# Patient Record
Sex: Female | Born: 1980 | Race: Black or African American | Hispanic: No | Marital: Single | Smoking: Never smoker
Health system: Southern US, Community
[De-identification: ages and names within clinical notes are randomized; demographics above are authoritative.]

## PROBLEM LIST (undated history)

## (undated) DIAGNOSIS — J302 Other seasonal allergic rhinitis: Secondary | ICD-10-CM

## (undated) DIAGNOSIS — R51 Headache: Secondary | ICD-10-CM

## (undated) DIAGNOSIS — H539 Unspecified visual disturbance: Secondary | ICD-10-CM

## (undated) DIAGNOSIS — E893 Postprocedural hypopituitarism: Secondary | ICD-10-CM

## (undated) DIAGNOSIS — D497 Neoplasm of unspecified behavior of endocrine glands and other parts of nervous system: Secondary | ICD-10-CM

## (undated) HISTORY — PX: BRAIN SURGERY: SHX531

## (undated) HISTORY — DX: Postprocedural hypopituitarism: E89.3

---

## 2003-04-15 ENCOUNTER — Other Ambulatory Visit: Admission: RE | Admit: 2003-04-15 | Discharge: 2003-04-15 | Payer: Self-pay | Admitting: Family Medicine

## 2006-02-11 ENCOUNTER — Other Ambulatory Visit: Admission: RE | Admit: 2006-02-11 | Discharge: 2006-02-11 | Payer: Self-pay | Admitting: Family Medicine

## 2006-08-18 ENCOUNTER — Ambulatory Visit (HOSPITAL_COMMUNITY): Admission: RE | Admit: 2006-08-18 | Discharge: 2006-08-18 | Payer: Self-pay | Admitting: Family Medicine

## 2007-08-09 ENCOUNTER — Encounter (INDEPENDENT_AMBULATORY_CARE_PROVIDER_SITE_OTHER): Payer: Self-pay | Admitting: Otolaryngology

## 2007-08-09 ENCOUNTER — Ambulatory Visit (HOSPITAL_BASED_OUTPATIENT_CLINIC_OR_DEPARTMENT_OTHER): Admission: RE | Admit: 2007-08-09 | Discharge: 2007-08-10 | Payer: Self-pay | Admitting: Otolaryngology

## 2007-08-09 HISTORY — PX: SALIVARY GLAND SURGERY: SHX768

## 2007-09-20 ENCOUNTER — Other Ambulatory Visit: Admission: RE | Admit: 2007-09-20 | Discharge: 2007-09-20 | Payer: Self-pay | Admitting: Family Medicine

## 2008-08-21 ENCOUNTER — Emergency Department (HOSPITAL_COMMUNITY): Admission: EM | Admit: 2008-08-21 | Discharge: 2008-08-21 | Payer: Self-pay | Admitting: Family Medicine

## 2008-11-05 ENCOUNTER — Other Ambulatory Visit: Admission: RE | Admit: 2008-11-05 | Discharge: 2008-11-05 | Payer: Self-pay | Admitting: Family Medicine

## 2009-11-20 ENCOUNTER — Other Ambulatory Visit: Admission: RE | Admit: 2009-11-20 | Discharge: 2009-11-20 | Payer: Self-pay | Admitting: Family Medicine

## 2010-08-18 NOTE — Op Note (Signed)
Marisa Galloway, Marisa Galloway              ACCOUNT NO.:  [id number]   MEDICAL RECORD NO.:  [id number]          PATIENT TYPE:  AMB   LOCATION:  DSC                          FACILITY:  [facility]   PHYSICIAN:  Onalee Hua L. 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   SURGICAL PROCEDURES:  Transcervical excision of left submandibular  gland.   SURGEON:  Kinnie Scales. Annalee Genta, MD   ASSISTANT:  Gloris Manchester. Wolicki, MD   COMPLICATIONS:  No complications.   ESTIMATED BLOOD LOSS:  Less than 50 mL.   DISPOSITION:  The patient transferred from the operating room to the  recovery room in stable condition.   BRIEF HISTORY:  The patient is a 30 year old black female who is  referred for evaluation of cervical lymphadenopathy.  She underwent CT  scanning of the neck and was found to have an asymptomatic submandibular  gland stone.  The patient's adenopathy resolved and approximately 6  months later she began to develop intermittent left-sided submandibular  gland swelling, discomfort, and obstruction, particularly when eating.  She was monitored over a 31-month period with increasing symptoms of  recurrent obstruction and pain.  No symptoms of infection.  Given her  history, physical examination, and prior CT scanning, they have  recommended to undertake left submandibular gland excision.  Risks,  benefits, and possible complications of surgical procedure were  discussed in detail with the patient and her mother and they understood  and concurred with our plan for surgery which was scheduled for Aug 09, 2007 under general  anesthesia as an outpatient with overnight  observation at [facility].   PROCEDURE:  The patient was brought to the operating room on Aug 09, 2007, and placed in supine position on the operating table.  General  endotracheal anesthesia was established without difficulty.  When the  patient was adequately anesthetized, she was positioned on the operating  table and prepped and draped in a sterile fashion.  The skin was  injected with 3 mL of 1% lidocaine 1:100,000 solution epinephrine  injected in the proposed skin incision in the anterior left superior  neck.  After allowing adequate time for vasoconstriction and hemostasis,  a 3-cm horizontally oriented incision was created in a preexisting skin  crease.  This was carried through the skin underlying subcutaneous  tissue using a #15 scalpel.  The superficial tissue was transected and  the platysmal layer was identified.  Platysma was divided and  subplatysmal flaps were elevated superiorly and inferiorly.  The  inferior aspect of the submandibular gland was palpated and  submandibular gland fascia was elevated.  Subfascial dissection was  carried out from inferior to superior identifying and preserving the  left marginal mandibular nerve throughout its course.  The vascular  contributions to  the gland including the common facial vein were divided  and suture ligated.  The gland was then gently distracted.  The anterior  and posterior bellies of the digastric muscle were identified and the  hypoglossal nerve was identified in the deep aspect of the left  submandibular space.  The gland was then gently dissected from the  surrounding structures.  The left lingual nerve was identified.  Its  contribution of the gland was divided and suture ligated and the nerve  was preserved throughout its course.  The mylohyoid muscle was elevated  anteriorly and the submandibular duct was palpated extending from the  gland to  the floor of mouth.  A large approximately 1 cm firm stone was  identified within the hilum of the gland and there was a significant  amount of inflammation between the gland and the surrounding tissues.  No evidence of active infection or purulent material.  The duct was  traced anteriorly.  The stone was palpated within the substance of the  gland itself and the left submandibular duct was divided and suture  ligated.  The patient's incision was then thoroughly irrigated with  sterile saline.  A 1/4-inch Penrose drain was then placed at the depth  of the incision and sutured into position.  The patient's incision was  then closed in multiple layers beginning with reapproximation of the  platysmal muscle with interrupted 4-0 Vicryl suture, deep subcutaneous  tissue was closed with the same stitch, and final skin edges were closed  with 5-0 Ethilon suture in an interrupted fashion.  The patient was then  awakened from anesthetic, she was extubated, and was transferred from  the operating room to recovery room in stable condition.  There were no  complications.  Blood loss was less than 50 mL.           ______________________________  Kinnie Scales. Annalee Genta, M.D.     DLS/MEDQ  D:  04/54/0981  T:  08/10/2007  Job:  [id number]

## 2010-12-24 ENCOUNTER — Other Ambulatory Visit: Payer: Self-pay | Admitting: Family Medicine

## 2010-12-24 ENCOUNTER — Other Ambulatory Visit (HOSPITAL_COMMUNITY)
Admission: RE | Admit: 2010-12-24 | Discharge: 2010-12-24 | Disposition: A | Discharge status: Home / Self Care | Payer: 59 | Source: Ambulatory Visit | Attending: Family Medicine | Admitting: Family Medicine

## 2010-12-24 DIAGNOSIS — Z01419 Encounter for gynecological examination (general) (routine) without abnormal findings: Secondary | ICD-10-CM | POA: Insufficient documentation

## 2010-12-24 DIAGNOSIS — Z113 Encounter for screening for infections with a predominantly sexual mode of transmission: Secondary | ICD-10-CM | POA: Insufficient documentation

## 2011-05-13 ENCOUNTER — Ambulatory Visit (HOSPITAL_COMMUNITY)
Admission: RE | Admit: 2011-05-13 | Discharge: 2011-05-13 | Disposition: A | Discharge status: Home / Self Care | Payer: 59 | Source: Ambulatory Visit | Attending: Specialist | Admitting: Specialist

## 2011-05-13 ENCOUNTER — Other Ambulatory Visit (HOSPITAL_COMMUNITY): Payer: Self-pay | Admitting: Specialist

## 2011-05-13 DIAGNOSIS — H5347 Heteronymous bilateral field defects: Secondary | ICD-10-CM

## 2011-05-13 MED ORDER — GADOBENATE DIMEGLUMINE 529 MG/ML IV SOLN
8.0000 mL | Freq: Once | INTRAVENOUS | Status: AC | PRN
  Administered 2011-05-13: 8 mL via INTRAVENOUS

## 2011-05-14 ENCOUNTER — Other Ambulatory Visit: Payer: Self-pay | Admitting: Neurosurgery

## 2011-05-14 DIAGNOSIS — D353 Benign neoplasm of craniopharyngeal duct: Secondary | ICD-10-CM

## 2011-05-14 DIAGNOSIS — D352 Benign neoplasm of pituitary gland: Secondary | ICD-10-CM

## 2011-05-17 ENCOUNTER — Other Ambulatory Visit (HOSPITAL_COMMUNITY): Payer: Self-pay | Admitting: *Deleted

## 2011-05-17 ENCOUNTER — Encounter (HOSPITAL_COMMUNITY)
Admission: RE | Admit: 2011-05-17 | Discharge: 2011-05-17 | Disposition: A | Discharge status: Home / Self Care | Payer: 59 | Source: Ambulatory Visit | Attending: Neurosurgery | Admitting: Neurosurgery

## 2011-05-17 ENCOUNTER — Encounter (HOSPITAL_COMMUNITY): Payer: Self-pay | Admitting: *Deleted

## 2011-05-17 ENCOUNTER — Encounter (HOSPITAL_COMMUNITY): Payer: Self-pay | Admitting: Pharmacy Technician

## 2011-05-17 LAB — COMPREHENSIVE METABOLIC PANEL
ALT: 11 U/L (ref 0–35)
AST: 13 U/L (ref 0–37)
Albumin: 3.5 g/dL (ref 3.5–5.2)
Alkaline Phosphatase: 63 U/L (ref 39–117)
BUN: 8 mg/dL (ref 6–23)
CO2: 25 mEq/L (ref 19–32)
Calcium: 9.9 mg/dL (ref 8.4–10.5)
Chloride: 100 mEq/L (ref 96–112)
Creatinine, Ser: 0.68 mg/dL (ref 0.50–1.10)
GFR calc Af Amer: >90 mL/min (ref >90)
GFR calc non Af Amer: >90 mL/min (ref >90)
Glucose, Bld: 77 mg/dL (ref 70–99)
Potassium: 4.3 mEq/L (ref 3.5–5.1)
Sodium: 137 mEq/L (ref 135–145)
Total Bilirubin: 0.4 mg/dL (ref 0.3–1.2)
Total Protein: 8.1 g/dL (ref 6.0–8.3)

## 2011-05-17 LAB — PROLACTIN: Prolactin: 41.6 ng/mL

## 2011-05-17 LAB — CBC
HCT: 41.7 % (ref 36.0–46.0)
Hemoglobin: 13.8 g/dL (ref 12.0–15.0)
MCH: 29.9 pg (ref 26.0–34.0)
MCHC: 33.1 g/dL (ref 30.0–36.0)
MCV: 90.3 fL (ref 78.0–100.0)
Platelets: 336 10*3/uL (ref 150–400)
RBC: 4.62 MIL/uL (ref 3.87–5.11)
RDW: 14.2 % (ref 11.5–15.5)
WBC: 7.8 10*3/uL (ref 4.0–10.5)

## 2011-05-17 LAB — DIFFERENTIAL
Basophils Absolute: 0 10*3/uL (ref 0.0–0.1)
Basophils Relative: 0 % (ref 0–1)
Eosinophils Absolute: 0.1 10*3/uL (ref 0.0–0.7)
Eosinophils Relative: 1 % (ref 0–5)
Lymphocytes Relative: 49 % — ABNORMAL HIGH (ref 12–46)
Lymphs Abs: 3.8 10*3/uL (ref 0.7–4.0)
Monocytes Absolute: 0.6 10*3/uL (ref 0.1–1.0)
Monocytes Relative: 8 % (ref 3–12)
Neutro Abs: 3.3 10*3/uL (ref 1.7–7.7)
Neutrophils Relative %: 42 % — ABNORMAL LOW (ref 43–77)

## 2011-05-17 LAB — URINALYSIS, ROUTINE W REFLEX MICROSCOPIC
Bilirubin Urine: NEGATIVE
Glucose, UA: NEGATIVE mg/dL
Ketones, ur: NEGATIVE mg/dL
Leukocytes, UA: NEGATIVE
Nitrite: NEGATIVE
Protein, ur: NEGATIVE mg/dL
Specific Gravity, Urine: 1.018 (ref 1.005–1.030)
Urobilinogen, UA: 1 mg/dL (ref 0.0–1.0)
pH: 6 (ref 5.0–8.0)

## 2011-05-17 LAB — CORTISOL-AM, BLOOD: Cortisol - AM: 26.2 ug/dL — ABNORMAL HIGH (ref 4.3–22.4)

## 2011-05-17 LAB — TYPE AND SCREEN
ABO/RH(D): O POS
Antibody Screen: NEGATIVE

## 2011-05-17 LAB — URINE MICROSCOPIC-ADD ON

## 2011-05-17 LAB — LUTEINIZING HORMONE: LH: <0.1 m[IU]/mL

## 2011-05-17 LAB — SURGICAL PCR SCREEN
MRSA, PCR: NEGATIVE
Staphylococcus aureus: NEGATIVE

## 2011-05-17 LAB — ABO/RH: ABO/RH(D): O POS

## 2011-05-17 LAB — T4: T4, Total: 12.8 ug/dL — ABNORMAL HIGH (ref 5.0–12.5)

## 2011-05-17 LAB — TSH: TSH: 1.638 u[IU]/mL (ref 0.350–4.500)

## 2011-05-17 LAB — FOLLICLE STIMULATING HORMONE: FSH: <0.3 m[IU]/mL

## 2011-05-17 MED ORDER — CEFAZOLIN SODIUM-DEXTROSE 2-3 GM-% IV SOLR
2.0000 g | INTRAVENOUS | Status: DC
  Filled 2011-05-17: qty 50

## 2011-05-17 NOTE — Progress Notes (Signed)
Left message for [facility], Dr. Thurmon Fair nurse, requesting orders for consent for his portion of the surgery.

## 2011-05-17 NOTE — Progress Notes (Signed)
Per Angelica Chessman in Dr. Thurmon Fair office no orders required.

## 2011-05-17 NOTE — Pre-Procedure Instructions (Addendum)
20 Marisa Galloway  05/17/2011   Your procedure is scheduled on:  Tuesday, February 12  Report to [facility] at 5:30 AM.  Call this number if you have problems the morning of surgery: [phone]   Remember:   Do not eat food:After Midnight.  May have clear liquids: up to 4 Hours before arrival.  Clear liquids include soda, tea, black coffee, apple or grape juice, broth.  Take these medicines the morning of surgery with A SIP OF WATER: none   Do not wear jewelry, make-up or nail polish.  Do not wear lotions, powders, or perfumes. You may wear deodorant.  Do not shave 48 hours prior to surgery.  Do not bring valuables to the hospital.  Contacts, dentures or bridgework may not be worn into surgery.  Leave suitcase in the car. After surgery it may be brought to your room.  For patients admitted to the hospital, checkout time is 11:00 AM the day of discharge.   Patients discharged the day of surgery will not be allowed to drive home.  Name and phone number of your driver: NA  Special Instructions: CHG Shower Use Special Wash: 1/2 bottle night before surgery and 1/2 bottle morning of surgery.   Please read over the following fact sheets that you were given: Pain Booklet, Coughing and Deep Breathing, Blood Transfusion Information and Surgical Site Infection Prevention

## 2011-05-18 ENCOUNTER — Encounter (HOSPITAL_COMMUNITY)
Admission: RE | Disposition: A | Discharge status: Home / Self Care | Payer: Self-pay | Source: Ambulatory Visit | Attending: Neurosurgery

## 2011-05-18 ENCOUNTER — Encounter (HOSPITAL_COMMUNITY): Payer: Self-pay | Admitting: *Deleted

## 2011-05-18 ENCOUNTER — Ambulatory Visit (HOSPITAL_COMMUNITY): Payer: 59 | Admitting: Anesthesiology

## 2011-05-18 ENCOUNTER — Inpatient Hospital Stay (HOSPITAL_COMMUNITY)
Admission: RE | Admit: 2011-05-18 | Discharge: 2011-05-25 | DRG: 615 | Disposition: A | Discharge status: Home / Self Care | Payer: 59 | Source: Ambulatory Visit | Attending: Neurosurgery | Admitting: Neurosurgery

## 2011-05-18 ENCOUNTER — Ambulatory Visit (HOSPITAL_COMMUNITY): Payer: 59

## 2011-05-18 ENCOUNTER — Encounter (HOSPITAL_COMMUNITY): Payer: Self-pay | Admitting: Anesthesiology

## 2011-05-18 ENCOUNTER — Other Ambulatory Visit: Payer: Self-pay | Admitting: Neurosurgery

## 2011-05-18 DIAGNOSIS — D352 Benign neoplasm of pituitary gland: Principal | ICD-10-CM | POA: Diagnosis present

## 2011-05-18 DIAGNOSIS — H5347 Heteronymous bilateral field defects: Secondary | ICD-10-CM | POA: Diagnosis present

## 2011-05-18 DIAGNOSIS — D353 Benign neoplasm of craniopharyngeal duct: Principal | ICD-10-CM | POA: Diagnosis present

## 2011-05-18 HISTORY — PX: NASAL SINUS SURGERY: SHX719

## 2011-05-18 HISTORY — DX: Unspecified visual disturbance: H53.9

## 2011-05-18 HISTORY — DX: Neoplasm of unspecified behavior of endocrine glands and other parts of nervous system: D49.7

## 2011-05-18 HISTORY — PX: CRANIOTOMY: SHX93

## 2011-05-18 HISTORY — DX: Headache: R51

## 2011-05-18 LAB — GRAM STAIN: Gram Stain: NONE SEEN

## 2011-05-18 LAB — BASIC METABOLIC PANEL
BUN: 5 mg/dL — ABNORMAL LOW (ref 6–23)
CO2: 20 mEq/L (ref 19–32)
Calcium: 8.1 mg/dL — ABNORMAL LOW (ref 8.4–10.5)
Chloride: 104 mEq/L (ref 96–112)
Creatinine, Ser: 0.52 mg/dL (ref 0.50–1.10)
GFR calc Af Amer: >90 mL/min (ref >90)
GFR calc non Af Amer: >90 mL/min (ref >90)
Glucose, Bld: 172 mg/dL — ABNORMAL HIGH (ref 70–99)
Potassium: 4 mEq/L (ref 3.5–5.1)
Sodium: 137 mEq/L (ref 135–145)

## 2011-05-18 LAB — ACTH: C206 ACTH: 10 pg/mL (ref 10–46)

## 2011-05-18 LAB — INSULIN-LIKE GROWTH FACTOR: Somatomedin C: 119 ng/mL (ref 66–336)

## 2011-05-18 LAB — POCT URINE SPECIFIC GRAVITY, REFRACTOMETER: Specific gravity, refractometer-urine: 1.0097 (ref 1.001–1.035)

## 2011-05-18 LAB — ESTROGENS, TOTAL: Estrogen: 94.4 pg/mL

## 2011-05-18 SURGERY — CRANIOTOMY HYPOPHYSECTOMY TRANSNASAL APPROACH
Anesthesia: General | Site: Spine Lumbar | Wound class: Clean Contaminated

## 2011-05-18 MED ORDER — MORPHINE SULFATE 10 MG/ML IJ SOLN
INTRAMUSCULAR | Status: DC | PRN
  Administered 2011-05-18: 4 mg via INTRAVENOUS

## 2011-05-18 MED ORDER — NALOXONE HCL 0.4 MG/ML IJ SOLN
INTRAMUSCULAR | Status: AC
  Filled 2011-05-18: qty 1

## 2011-05-18 MED ORDER — MAGNESIUM HYDROXIDE 400 MG/5ML PO SUSP: 30.0000 mL | Freq: Every day | ORAL | Status: DC | PRN

## 2011-05-18 MED ORDER — CYCLOBENZAPRINE HCL 5 MG PO TABS
5.0000 mg | ORAL_TABLET | Freq: Three times a day (TID) | ORAL | Status: DC | PRN
  Administered 2011-05-18 – 2011-05-19 (×3): 5 mg via ORAL
  Administered 2011-05-19 – 2011-05-22 (×5): 10 mg via ORAL
  Filled 2011-05-18 (×7): qty 2

## 2011-05-18 MED ORDER — MORPHINE SULFATE 2 MG/ML IJ SOLN
1.0000 mg | INTRAMUSCULAR | Status: DC | PRN
  Administered 2011-05-18 (×2): 2 mg via INTRAVENOUS
  Administered 2011-05-18 (×2): 1 mg via INTRAVENOUS
  Administered 2011-05-19 – 2011-05-24 (×8): 2 mg via INTRAVENOUS
  Filled 2011-05-18 (×12): qty 1

## 2011-05-18 MED ORDER — CEFAZOLIN SODIUM 1-5 GM-% IV SOLN
INTRAVENOUS | Status: AC
  Filled 2011-05-18: qty 50

## 2011-05-18 MED ORDER — PROPOFOL 10 MG/ML IV EMUL
INTRAVENOUS | Status: DC | PRN
  Administered 2011-05-18: 200 mg via INTRAVENOUS

## 2011-05-18 MED ORDER — HYDROMORPHONE HCL PF 1 MG/ML IJ SOLN: 0.2500 mg | INTRAMUSCULAR | Status: DC | PRN

## 2011-05-18 MED ORDER — LABETALOL HCL 5 MG/ML IV SOLN
INTRAVENOUS | Status: AC
  Filled 2011-05-18: qty 4

## 2011-05-18 MED ORDER — PANTOPRAZOLE SODIUM 40 MG IV SOLR
40.0000 mg | INTRAVENOUS | Status: AC
  Administered 2011-05-18: 40 mg via INTRAVENOUS
  Filled 2011-05-18: qty 40

## 2011-05-18 MED ORDER — PROMETHAZINE HCL 25 MG/ML IJ SOLN: 6.2500 mg | INTRAMUSCULAR | Status: DC | PRN

## 2011-05-18 MED ORDER — CHLORHEXIDINE GLUCONATE 0.12 % MT SOLN
OROMUCOSAL | Status: AC
  Administered 2011-05-18: 15:00:00
  Filled 2011-05-18: qty 15

## 2011-05-18 MED ORDER — LIDOCAINE HCL (CARDIAC) 20 MG/ML IV SOLN
INTRAVENOUS | Status: DC | PRN
  Administered 2011-05-18: 40 mg via INTRAVENOUS

## 2011-05-18 MED ORDER — GLYCOPYRROLATE 0.2 MG/ML IJ SOLN
INTRAMUSCULAR | Status: DC | PRN
  Administered 2011-05-18: .6 mg via INTRAVENOUS

## 2011-05-18 MED ORDER — BISACODYL 10 MG RE SUPP
10.0000 mg | Freq: Every day | RECTAL | Status: DC | PRN
  Administered 2011-05-22: 10 mg via RECTAL
  Filled 2011-05-18: qty 1

## 2011-05-18 MED ORDER — DEXTROSE 5 % IV SOLN
1.0000 g | INTRAVENOUS | Status: DC
  Filled 2011-05-18: qty 10

## 2011-05-18 MED ORDER — HYDRALAZINE HCL 20 MG/ML IJ SOLN: 5.0000 mg | INTRAMUSCULAR | Status: DC | PRN

## 2011-05-18 MED ORDER — KCL IN DEXTROSE-NACL 20-5-0.45 MEQ/L-%-% IV SOLN: INTRAVENOUS | Status: DC

## 2011-05-18 MED ORDER — SODIUM CHLORIDE 0.9 % IV SOLN
INTRAVENOUS | Status: AC
  Filled 2011-05-18: qty 500

## 2011-05-18 MED ORDER — MEPERIDINE HCL 25 MG/ML IJ SOLN: 6.2500 mg | INTRAMUSCULAR | Status: DC | PRN

## 2011-05-18 MED ORDER — HYDROXYZINE HCL 50 MG PO TABS
50.0000 mg | ORAL_TABLET | ORAL | Status: DC | PRN
  Filled 2011-05-18: qty 1

## 2011-05-18 MED ORDER — MIDAZOLAM HCL 5 MG/5ML IJ SOLN
INTRAMUSCULAR | Status: DC | PRN
  Administered 2011-05-18: 2 mg via INTRAVENOUS

## 2011-05-18 MED ORDER — LACTATED RINGERS IV SOLN
INTRAVENOUS | Status: DC | PRN
  Administered 2011-05-18: 07:00:00 via INTRAVENOUS

## 2011-05-18 MED ORDER — ROCURONIUM BROMIDE 100 MG/10ML IV SOLN
INTRAVENOUS | Status: DC | PRN
  Administered 2011-05-18: 50 mg via INTRAVENOUS

## 2011-05-18 MED ORDER — NEOSTIGMINE METHYLSULFATE 1 MG/ML IJ SOLN
INTRAMUSCULAR | Status: DC | PRN
  Administered 2011-05-18: 4 mg via INTRAVENOUS

## 2011-05-18 MED ORDER — LABETALOL HCL 5 MG/ML IV SOLN: 10.0000 mg | INTRAVENOUS | Status: DC | PRN

## 2011-05-18 MED ORDER — POTASSIUM CHLORIDE 2 MEQ/ML IV SOLN
Freq: Once | INTRAVENOUS | Status: AC
  Administered 2011-05-18: 100 mL/h via INTRAVENOUS
  Filled 2011-05-18: qty 1000

## 2011-05-18 MED ORDER — MUPIROCIN 2 % EX OINT
TOPICAL_OINTMENT | CUTANEOUS | Status: DC | PRN
  Administered 2011-05-18: 1 via NASAL

## 2011-05-18 MED ORDER — ONDANSETRON HCL 4 MG/2ML IJ SOLN
INTRAMUSCULAR | Status: DC | PRN
  Administered 2011-05-18: 4 mg via INTRAVENOUS

## 2011-05-18 MED ORDER — ESMOLOL HCL 10 MG/ML IV SOLN
INTRAVENOUS | Status: DC | PRN
  Administered 2011-05-18: 20 mg via INTRAVENOUS

## 2011-05-18 MED ORDER — HEMOSTATIC AGENTS (NO CHARGE) OPTIME
TOPICAL | Status: DC | PRN
  Administered 2011-05-18: 1 via TOPICAL

## 2011-05-18 MED ORDER — OXYMETAZOLINE HCL 0.05 % NA SOLN
NASAL | Status: DC | PRN
  Administered 2011-05-18 (×2): 1 via NASAL

## 2011-05-18 MED ORDER — DROSPIRENONE-ETHINYL ESTRADIOL 3-0.02 MG PO TABS
1.0000 | ORAL_TABLET | Freq: Every day | ORAL | Status: DC
Start: 2011-05-19 — End: 2011-05-19

## 2011-05-18 MED ORDER — SODIUM CHLORIDE 0.9 % IV SOLN
INTRAVENOUS | Status: DC | PRN
  Administered 2011-05-18 (×2): via INTRAVENOUS

## 2011-05-18 MED ORDER — VECURONIUM BROMIDE 10 MG IV SOLR
INTRAVENOUS | Status: DC | PRN
  Administered 2011-05-18: 1 mg via INTRAVENOUS
  Administered 2011-05-18 (×2): 3 mg via INTRAVENOUS

## 2011-05-18 MED ORDER — PANTOPRAZOLE SODIUM 40 MG IV SOLR
40.0000 mg | Freq: Every day | INTRAVENOUS | Status: DC
  Administered 2011-05-18 – 2011-05-19 (×2): 40 mg via INTRAVENOUS
  Filled 2011-05-18 (×4): qty 40

## 2011-05-18 MED ORDER — HYDROCODONE-ACETAMINOPHEN 5-325 MG PO TABS
1.0000 | ORAL_TABLET | ORAL | Status: DC | PRN
  Administered 2011-05-23: 2 via ORAL
  Administered 2011-05-23 – 2011-05-24 (×2): 1 via ORAL
  Filled 2011-05-18: qty 1
  Filled 2011-05-18 (×2): qty 2

## 2011-05-18 MED ORDER — LIDOCAINE-EPINEPHRINE 1 %-1:100000 IJ SOLN
INTRAMUSCULAR | Status: DC | PRN
  Administered 2011-05-18: 8 mL
  Administered 2011-05-18: 5 mL

## 2011-05-18 MED ORDER — MUPIROCIN CALCIUM 2 % EX CREA
TOPICAL_CREAM | Freq: Three times a day (TID) | CUTANEOUS | Status: DC
  Administered 2011-05-18 – 2011-05-24 (×17): via TOPICAL
  Filled 2011-05-18 (×2): qty 15

## 2011-05-18 MED ORDER — THROMBIN 5000 UNITS EX KIT
PACK | CUTANEOUS | Status: DC | PRN
  Administered 2011-05-18 (×2): 5000 [IU] via TOPICAL

## 2011-05-18 MED ORDER — FENTANYL CITRATE 0.05 MG/ML IJ SOLN
INTRAMUSCULAR | Status: DC | PRN
  Administered 2011-05-18 (×3): 50 ug via INTRAVENOUS
  Administered 2011-05-18: 100 ug via INTRAVENOUS
  Administered 2011-05-18 (×4): 50 ug via INTRAVENOUS

## 2011-05-18 MED ORDER — DEXTROSE 5 % IV SOLN
1.0000 g | INTRAVENOUS | Status: DC
  Administered 2011-05-18 – 2011-05-25 (×8): 1 g via INTRAVENOUS
  Filled 2011-05-18 (×8): qty 10

## 2011-05-18 MED ORDER — KCL IN DEXTROSE-NACL 20-5-0.45 MEQ/L-%-% IV SOLN
INTRAVENOUS | Status: DC
  Administered 2011-05-18: 16:00:00 via INTRAVENOUS
  Filled 2011-05-18 (×4): qty 1000

## 2011-05-18 MED ORDER — 0.9 % SODIUM CHLORIDE (POUR BTL) OPTIME
TOPICAL | Status: DC | PRN
  Administered 2011-05-18: 1000 mL

## 2011-05-18 MED ORDER — BACITRACIN 50000 UNITS IM SOLR
INTRAMUSCULAR | Status: AC
  Filled 2011-05-18: qty 1

## 2011-05-18 MED ORDER — OXYMETAZOLINE HCL 0.05 % NA SOLN
NASAL | Status: DC | PRN
  Administered 2011-05-18: 2 via NASAL

## 2011-05-18 MED ORDER — BUPIVACAINE HCL (PF) 0.5 % IJ SOLN
INTRAMUSCULAR | Status: DC | PRN
  Administered 2011-05-18: 5 mL

## 2011-05-18 MED ORDER — HYDROXYZINE HCL 50 MG/ML IM SOLN
50.0000 mg | Freq: Four times a day (QID) | INTRAMUSCULAR | Status: DC | PRN
  Filled 2011-05-18: qty 1

## 2011-05-18 MED ORDER — PHENYLEPHRINE HCL 10 MG/ML IJ SOLN
10.0000 mg | INTRAVENOUS | Status: DC | PRN
  Administered 2011-05-18: 10 ug/min via INTRAVENOUS

## 2011-05-18 SURGICAL SUPPLY — 128 items
ADH SKN CLS APL DERMABOND .7 (GAUZE/BANDAGES/DRESSINGS) ×2
APL SKNCLS STERI-STRIP NONHPOA (GAUZE/BANDAGES/DRESSINGS)
ATTRACTOMAT 16X20 MAGNETIC DRP (DRAPES) IMPLANT
BAG DECANTER FOR FLEXI CONT (MISCELLANEOUS) IMPLANT
BALL CTTN LRG ABS STRL LF (GAUZE/BANDAGES/DRESSINGS)
BENZOIN TINCTURE PRP APPL 2/3 (GAUZE/BANDAGES/DRESSINGS) ×2 IMPLANT
BLADE EYE SICKLE 84 5 BEAV (BLADE) IMPLANT
BLADE IRRIGATOR 40D CVD (IRRIGATION / IRRIGATOR) IMPLANT
BLADE IRRIGATOR 60D CVD (IRRIGATION / IRRIGATOR) IMPLANT
BLADE SURG 10 STRL SS (BLADE) ×3 IMPLANT
BLADE SURG 11 STRL SS (BLADE) ×5 IMPLANT
BLADE SURG 15 STRL LF DISP TIS (BLADE) ×2 IMPLANT
BLADE SURG 15 STRL SS (BLADE) ×3
CANISTER SUCTION 2500CC (MISCELLANEOUS) ×7 IMPLANT
CATH ROBINSON RED A/P 10FR (CATHETERS) ×2 IMPLANT
CATH ROBINSON RED A/P 14FR (CATHETERS) IMPLANT
CLOTH BEACON ORANGE TIMEOUT ST (SAFETY) ×3 IMPLANT
CONT SPEC 4OZ CLIKSEAL STRL BL (MISCELLANEOUS) ×6 IMPLANT
CORDS BIPOLAR (ELECTRODE) ×3 IMPLANT
COTTONBALL LRG STERILE PKG (GAUZE/BANDAGES/DRESSINGS) ×2 IMPLANT
COVER SURGICAL LIGHT HANDLE (MISCELLANEOUS) ×2 IMPLANT
DECANTER SPIKE VIAL GLASS SM (MISCELLANEOUS) ×3 IMPLANT
DEPRESSOR TONGUE BLADE STERILE (MISCELLANEOUS) ×2 IMPLANT
DERMABOND ADVANCED (GAUZE/BANDAGES/DRESSINGS) ×1
DERMABOND ADVANCED .7 DNX12 (GAUZE/BANDAGES/DRESSINGS) IMPLANT
DRAIN SUBARACHNOID (WOUND CARE) ×1 IMPLANT
DRAPE C-ARM 42X72 X-RAY (DRAPES) ×6 IMPLANT
DRAPE EENT ADH APERT 15X15 STR (DRAPES) IMPLANT
DRAPE INCISE IOBAN 66X45 STRL (DRAPES) ×4 IMPLANT
DRAPE MICROSCOPE LEICA (MISCELLANEOUS) ×3 IMPLANT
DRAPE POUCH INSTRU U-SHP 10X18 (DRAPES) ×3 IMPLANT
DRAPE PROXIMA HALF (DRAPES) ×7 IMPLANT
DRESSING NASAL POPE 10X1.5X2.5 (GAUZE/BANDAGES/DRESSINGS) IMPLANT
DRESSING TELFA 8X3 (GAUZE/BANDAGES/DRESSINGS) ×6 IMPLANT
DRSG NASAL POPE 10X1.5X2.5 (GAUZE/BANDAGES/DRESSINGS) ×3
DRSG OPSITE 4X5.5 SM (GAUZE/BANDAGES/DRESSINGS) ×1 IMPLANT
DURAFORM COLLAGEN 1X1 5-PACK (Neuro Prosthesis/Implant) ×1 IMPLANT
DURAPREP 26ML APPLICATOR (WOUND CARE) ×3 IMPLANT
DURASEAL SPINE SEALANT 3ML (MISCELLANEOUS) ×1 IMPLANT
ELECT CAUTERY BLADE 6.4 (BLADE) ×3 IMPLANT
ELECT COATED BLADE 2.86 ST (ELECTRODE) ×3 IMPLANT
ELECT NDL TIP 2.8 STRL (NEEDLE) ×2 IMPLANT
ELECT NEEDLE TIP 2.8 STRL (NEEDLE) ×3 IMPLANT
ELECT REM PT RETURN 9FT ADLT (ELECTROSURGICAL) ×6
ELECTRODE REM PT RTRN 9FT ADLT (ELECTROSURGICAL) ×4 IMPLANT
GAUZE PACKING FOLDED 1IN STRL (GAUZE/BANDAGES/DRESSINGS) ×2 IMPLANT
GAUZE PACKING FOLDED 2  STR (GAUZE/BANDAGES/DRESSINGS)
GAUZE PACKING FOLDED 2 STR (GAUZE/BANDAGES/DRESSINGS) ×2 IMPLANT
GAUZE SPONGE 2X2 8PLY STRL LF (GAUZE/BANDAGES/DRESSINGS) ×2 IMPLANT
GLOVE BIOGEL M 7.0 STRL (GLOVE) ×4 IMPLANT
GLOVE BIOGEL PI IND STRL 6.5 (GLOVE) IMPLANT
GLOVE BIOGEL PI IND STRL 8 (GLOVE) ×2 IMPLANT
GLOVE BIOGEL PI INDICATOR 6.5 (GLOVE) ×2
GLOVE BIOGEL PI INDICATOR 8 (GLOVE) ×1
GLOVE ECLIPSE 7.5 STRL STRAW (GLOVE) ×6 IMPLANT
GLOVE EXAM NITRILE LRG STRL (GLOVE) IMPLANT
GLOVE EXAM NITRILE MD LF STRL (GLOVE) IMPLANT
GLOVE EXAM NITRILE XL STR (GLOVE) IMPLANT
GLOVE EXAM NITRILE XS STR PU (GLOVE) IMPLANT
GLOVE SURG SS PI 6.5 STRL IVOR (GLOVE) ×2 IMPLANT
GOWN BRE IMP SLV AUR LG STRL (GOWN DISPOSABLE) ×2 IMPLANT
GOWN BRE IMP SLV AUR XL STRL (GOWN DISPOSABLE) ×3 IMPLANT
GOWN STRL REIN 2XL LVL4 (GOWN DISPOSABLE) IMPLANT
HEMOSTAT SURGICEL 2X14 (HEMOSTASIS) ×2 IMPLANT
IRRIGATOR 4MM STR (IRRIGATION / IRRIGATOR) ×2 IMPLANT
KIT BASIN OR (CUSTOM PROCEDURE TRAY) ×5 IMPLANT
KIT DRAIN CSF ACCUDRAIN (MISCELLANEOUS) ×1 IMPLANT
KIT ROOM TURNOVER OR (KITS) ×3 IMPLANT
MARKER SKIN DUAL TIP RULER LAB (MISCELLANEOUS) ×3 IMPLANT
MARKER SPHERE PSV REFLC NDI (MISCELLANEOUS) ×10 IMPLANT
MIS BUR, 2.5 MM NEURO ×1 IMPLANT
NDL 18GX1X1/2 (RX/OR ONLY) (NEEDLE) IMPLANT
NDL HYPO 25X1 1.5 SAFETY (NEEDLE) ×2 IMPLANT
NDL SPNL 22GX3.5 QUINCKE BK (NEEDLE) ×2 IMPLANT
NEEDLE 18GX1X1/2 (RX/OR ONLY) (NEEDLE) IMPLANT
NEEDLE HYPO 25X1 1.5 SAFETY (NEEDLE) ×3 IMPLANT
NEEDLE SPNL 22GX3.5 QUINCKE BK (NEEDLE) ×3 IMPLANT
NS IRRIG 1000ML POUR BTL (IV SOLUTION) ×3 IMPLANT
PAD ARMBOARD 7.5X6 YLW CONV (MISCELLANEOUS) ×7 IMPLANT
PATTIES SURGICAL .25X.25 (GAUZE/BANDAGES/DRESSINGS) IMPLANT
PATTIES SURGICAL .5 X.5 (GAUZE/BANDAGES/DRESSINGS) ×3 IMPLANT
PATTIES SURGICAL .5 X3 (DISPOSABLE) ×2 IMPLANT
PENCIL BUTTON HOLSTER BLD 10FT (ELECTRODE) ×3 IMPLANT
RUBBERBAND STERILE (MISCELLANEOUS) ×6 IMPLANT
SEALANT DURASEAL SPINE 3ML (Neuro Prosthesis/Implant) ×1 IMPLANT
SET DIEGO TUBE 70338003 (OTOLOGIC RELATED) ×2 IMPLANT
SHEET SIL 040 (INSTRUMENTS) IMPLANT
SPECIMEN JAR SMALL (MISCELLANEOUS) ×5 IMPLANT
SPLINT NASAL DOYLE BI-VL (GAUZE/BANDAGES/DRESSINGS) ×1 IMPLANT
SPONGE GAUZE 2X2 STER 10/PKG (GAUZE/BANDAGES/DRESSINGS) ×1
SPONGE GAUZE 4X4 12PLY (GAUZE/BANDAGES/DRESSINGS) ×4 IMPLANT
SPONGE LAP 4X18 X RAY DECT (DISPOSABLE) ×3 IMPLANT
SPONGE NEURO XRAY DETECT 1X3 (DISPOSABLE) ×3 IMPLANT
SPONGE SURGIFOAM ABS GEL 100 (HEMOSTASIS) IMPLANT
SPONGE SURGIFOAM ABS GEL 12-7 (HEMOSTASIS) IMPLANT
STAPLER SKIN PROX WIDE 3.9 (STAPLE) ×3 IMPLANT
STRIP CLOSURE SKIN 1/2X4 (GAUZE/BANDAGES/DRESSINGS) IMPLANT
STRIP CLOSURE SKIN 1/4X4 (GAUZE/BANDAGES/DRESSINGS) IMPLANT
SUT BONE WAX W31G (SUTURE) ×3 IMPLANT
SUT CHROMIC 3 0 PS 2 (SUTURE) IMPLANT
SUT CHROMIC 4 0 P 3 18 (SUTURE) IMPLANT
SUT ETHILON 3 0 FSL (SUTURE) IMPLANT
SUT ETHILON 3 0 PS 1 (SUTURE) ×2 IMPLANT
SUT ETHILON 4 0 PS 2 18 (SUTURE) IMPLANT
SUT ETHILON 6 0 P 1 (SUTURE) ×1 IMPLANT
SUT NOVAFIL 6 0 PRE 2 4412 13 (SUTURE) IMPLANT
SUT PLAIN 4 0 ~~LOC~~ 1 (SUTURE) ×1 IMPLANT
SUT PROLENE 6 0 BV (SUTURE) IMPLANT
SUT SILK 0 TIES 10X30 (SUTURE) ×1 IMPLANT
SUT VIC AB 2-0 CP2 18 (SUTURE) ×3 IMPLANT
SUT VIC AB 2-0 CT1 27 (SUTURE)
SUT VIC AB 2-0 CT1 27XBRD (SUTURE) IMPLANT
SUT VIC AB 3-0 SH 8-18 (SUTURE) ×3 IMPLANT
SUT VIC AB 4-0 P-3 18X BRD (SUTURE) IMPLANT
SUT VIC AB 4-0 P3 18 (SUTURE) ×3
SWAB COLLECTION DEVICE MRSA (MISCELLANEOUS) IMPLANT
SYR 5ML LL (SYRINGE) ×3 IMPLANT
SYR BULB 3OZ (MISCELLANEOUS) IMPLANT
SYR CONTROL 10ML LL (SYRINGE) ×6 IMPLANT
SYR TB 1ML 25GX5/8 (SYRINGE) IMPLANT
TOWEL OR 17X24 6PK STRL BLUE (TOWEL DISPOSABLE) ×4 IMPLANT
TOWEL OR 17X26 10 PK STRL BLUE (TOWEL DISPOSABLE) ×4 IMPLANT
TRAP SPECIMEN MUCOUS 40CC (MISCELLANEOUS) IMPLANT
TRAY ENT MC OR (CUSTOM PROCEDURE TRAY) ×5 IMPLANT
TRAY FOLEY CATH 16FRSI W/METER (SET/KITS/TRAYS/PACK) ×3 IMPLANT
TUBE ANAEROBIC SPECIMEN COL (MISCELLANEOUS) IMPLANT
UNDERPAD 30X30 INCONTINENT (UNDERPADS AND DIAPERS) ×3 IMPLANT
WATER STERILE IRR 1000ML POUR (IV SOLUTION) ×6 IMPLANT

## 2011-05-18 NOTE — Progress Notes (Signed)
Per MD 

## 2011-05-18 NOTE — Transfer of Care (Signed)
Immediate Anesthesia Transfer of Care Note  Patient: Marisa Galloway  Procedure(s) Performed: Procedure(s) (LRB): CRANIOTOMY HYPOPHYSECTOMY TRANSNASAL APPROACH (N/A) ENDOSCOPIC SINUS SURGERY WITH STEALTH (N/A) FLUOROSCOPY GUIDED LUMBAR PUNCTURE DRAIN CSF (N/A)  Patient Location: PACU  Anesthesia Type: General  Level of Consciousness: awake  Airway & Oxygen Therapy: Patient Spontanous Breathing and Patient connected to face mask oxygen  Post-op Assessment: Report given to PACU RN, Patient moving all extremities. Pt with shallow respirations upon arrival to PACU.  Assist with ambu bag. Anesthesiologist at bedside.  Pt given IV Narcan 0.1mg  per MDA.  Pt switched to face tent.  SaO2 99-100%.  Post vital signs: Reviewed and stable  Complications: respiratory complications and see above note

## 2011-05-18 NOTE — Brief Op Note (Signed)
05/18/2011  11:30 AM  PATIENT:  Marisa Galloway  31 y.o. female  PRE-OPERATIVE DIAGNOSIS:  pituitary tumor bitemporal hemianopsia  POST-OPERATIVE DIAGNOSIS:  pituitary tumor bitemporal hemianopsia  PROCEDURE:  Procedure(s) (LRB): Trans-Septal Trans-Sphenoidal Approach for Pituitary Resection with Electrical engineer)   SURGEON:  Surgeon(s) and Role: Panel 1:    * Hewitt Shorts, MD - Primary  Panel 2:    * Barbee Cough, MD - Primary  Panel 3:    * Hewitt Shorts, MD - Primary  PHYSICIAN ASSISTANT:   ASSISTANTS: none   ANESTHESIA:   general  EBL:  Total I/O In: 2400 [I.V.:2400] Out: 850 [Urine:750; Blood:100]  BLOOD ADMINISTERED:none  DRAINS: Lumbar Drain   LOCAL MEDICATIONS USED:  8 cc Lidocaine  SPECIMEN:  Biopsy / Limited Resection  DISPOSITION OF SPECIMEN:  PATHOLOGY  COUNTS:  YES  TOURNIQUET:  * No tourniquets in log *  DICTATION: .Other Dictation: Dictation Number [id number]  PLAN OF CARE: Admit to inpatient   PATIENT DISPOSITION:  PACU - hemodynamically stable.   Delay start of Pharmacological VTE agent (>24hrs) due to surgical blood loss or risk of bleeding: no

## 2011-05-18 NOTE — Anesthesia Preprocedure Evaluation (Addendum)
Anesthesia Evaluation    Airway Mallampati: I  Neck ROM: Full    Dental  (+) Teeth Intact   Pulmonary  clear to auscultation        Cardiovascular Regular Normal    Neuro/Psych    GI/Hepatic   Endo/Other    Renal/GU      Musculoskeletal   Abdominal   Peds  Hematology   Anesthesia Other Findings   Reproductive/Obstetrics                           Anesthesia Physical Anesthesia Plan  ASA: I  Anesthesia Plan: General   Post-op Pain Management:    Induction: Intravenous  Airway Management Planned: Oral ETT  Additional Equipment: Arterial line  Intra-op Plan:   Post-operative Plan: Extubation in OR  Informed Consent: I have reviewed the patients History and Physical, chart, labs and discussed the procedure including the risks, benefits and alternatives for the proposed anesthesia with the patient or authorized representative who has indicated his/her understanding and acceptance.   Dental advisory given  Plan Discussed with: CRNA and Surgeon  Anesthesia Plan Comments:         Anesthesia Quick Evaluation

## 2011-05-18 NOTE — Progress Notes (Signed)
Pt. Breathing on own, oral airway out , pt. Sating 100 on 9 liters face tent, following commands

## 2011-05-18 NOTE — Anesthesia Procedure Notes (Signed)
Procedure Name: Intubation Date/Time: 05/18/2011 7:51 AM Performed by: Elizbeth Squires Pre-anesthesia Checklist: Patient identified, Emergency Drugs available, Suction available and Patient being monitored Patient Re-evaluated:Patient Re-evaluated prior to inductionOxygen Delivery Method: Circle System Utilized Preoxygenation: Pre-oxygenation with 100% oxygen Intubation Type: IV induction Ventilation: Mask ventilation without difficulty Laryngoscope Size: Mac and 3 Grade View: Grade I Tube type: Oral Tube size: 7.0 mm Number of attempts: 1 Airway Equipment and Method: stylet Placement Confirmation: ETT inserted through vocal cords under direct vision,  positive ETCO2,  CO2 detector and breath sounds checked- equal and bilateral Secured at: 22 cm Tube secured with: Tape Dental Injury: Teeth and Oropharynx as per pre-operative assessment

## 2011-05-18 NOTE — Anesthesia Postprocedure Evaluation (Signed)
  Anesthesia Post-op Note  Patient: Marisa Galloway  Procedure(s) Performed: Procedure(s) (LRB): CRANIOTOMY HYPOPHYSECTOMY TRANSNASAL APPROACH (N/A) ENDOSCOPIC SINUS SURGERY WITH STEALTH (N/A) FLUOROSCOPY GUIDED LUMBAR PUNCTURE DRAIN CSF (N/A)  Patient Location: PACU  Anesthesia Type: General  Level of Consciousness: awake and oriented  Airway and Oxygen Therapy: Patient Spontanous Breathing  Post-op Pain: mild  Post-op Assessment: Post-op Vital signs reviewed  Post-op Vital Signs: stable  Complications: No apparent anesthesia complications

## 2011-05-18 NOTE — Consult Note (Signed)
Reason for Consult:Pituitary Tumor  Referring Physician: Dr. Natalia Leatherwood is an 31 y.o. female.  HPI: Pt with progressive visual changes and headache. Eval including visual fields and MRI c/w pituitary mass and compression of the optic apparatus.  Past Medical History  Diagnosis Date  . Headache     off and on for last month  . Vision changes     bilateral hemianopsia  . Pituitary tumor     Past Surgical History  Procedure Date  . Salivary gland surgery 08/09/2007    History reviewed. No pertinent family history.  Social History:  reports that she has never smoked. She does not have any smokeless tobacco history on file. She reports that she drinks alcohol. She reports that she does not use illicit drugs.  Allergies: No Known Allergies  Medications: I have reviewed the patient's current medications.  Results for orders placed during the hospital encounter of 05/18/11 (from the past 48 hour(s))  SURGICAL PCR SCREEN     Status: Normal   Collection Time   05/17/11 10:45 AM      Component Value Range Comment   MRSA, PCR NEGATIVE  NEGATIVE     Staphylococcus aureus NEGATIVE  NEGATIVE    HCG, SERUM, QUALITATIVE     Status: Normal   Collection Time   05/17/11 11:02 AM      Component Value Range Comment   Preg, Serum NEGATIVE  NEGATIVE    CBC     Status: Normal   Collection Time   05/17/11 11:02 AM      Component Value Range Comment   WBC 7.8  4.0 - 10.5 (K/uL)    RBC 4.62  3.87 - 5.11 (MIL/uL)    Hemoglobin 13.8  12.0 - 15.0 (g/dL)    HCT 40.9  81.1 - 91.4 (%)    MCV 90.3  78.0 - 100.0 (fL)    MCH 29.9  26.0 - 34.0 (pg)    MCHC 33.1  30.0 - 36.0 (g/dL)    RDW 78.2  95.6 - 21.3 (%)    Platelets 336  150 - 400 (K/uL)   COMPREHENSIVE METABOLIC PANEL     Status: Normal   Collection Time   05/17/11 11:02 AM      Component Value Range Comment   Sodium 137  135 - 145 (mEq/L)    Potassium 4.3  3.5 - 5.1 (mEq/L)    Chloride 100  96 - 112 (mEq/L)    CO2 25  19 - 32  (mEq/L)    Glucose, Bld 77  70 - 99 (mg/dL)    BUN 8  6 - 23 (mg/dL)    Creatinine, Ser 0.86  0.50 - 1.10 (mg/dL)    Calcium 9.9  8.4 - 10.5 (mg/dL)    Total Protein 8.1  6.0 - 8.3 (g/dL)    Albumin 3.5  3.5 - 5.2 (g/dL)    AST 13  0 - 37 (U/L)    ALT 11  0 - 35 (U/L)    Alkaline Phosphatase 63  39 - 117 (U/L)    Total Bilirubin 0.4  0.3 - 1.2 (mg/dL)    GFR calc non Af Amer >90  >90 (mL/min)    GFR calc Af Amer >90  >90 (mL/min)   DIFFERENTIAL     Status: Abnormal   Collection Time   05/17/11 11:02 AM      Component Value Range Comment   Neutrophils Relative 42 (*) 43 - 77 (%)  Neutro Abs 3.3  1.7 - 7.7 (K/uL)    Lymphocytes Relative 49 (*) 12 - 46 (%)    Lymphs Abs 3.8  0.7 - 4.0 (K/uL)    Monocytes Relative 8  3 - 12 (%)    Monocytes Absolute 0.6  0.1 - 1.0 (K/uL)    Eosinophils Relative 1  0 - 5 (%)    Eosinophils Absolute 0.1  0.0 - 0.7 (K/uL)    Basophils Relative 0  0 - 1 (%)    Basophils Absolute 0.0  0.0 - 0.1 (K/uL)   CORTISOL-AM, BLOOD     Status: Abnormal   Collection Time   05/17/11 11:02 AM      Component Value Range Comment   Cortisol - AM 26.2 (*) 4.3 - 22.4 (ug/dL)   FOLLICLE STIMULATING HORMONE     Status: Normal   Collection Time   05/17/11 11:02 AM      Component Value Range Comment   FSH <0.3     LUTEINIZING HORMONE     Status: Normal   Collection Time   05/17/11 11:02 AM      Component Value Range Comment   LH <0.1     PROLACTIN     Status: Normal   Collection Time   05/17/11 11:02 AM      Component Value Range Comment   Prolactin 41.6     T4     Status: Abnormal   Collection Time   05/17/11 11:02 AM      Component Value Range Comment   T4, Total 12.8 (*) 5.0 - 12.5 (ug/dL)   TSH     Status: Normal   Collection Time   05/17/11 11:02 AM      Component Value Range Comment   TSH 1.638  0.350 - 4.500 (uIU/mL)     No results found.  Review of Systems  Constitutional: Negative.   HENT: Negative.   Respiratory: Negative.   Cardiovascular:  Negative.   Musculoskeletal: Negative.   Skin: Negative.    Blood pressure 105/72, pulse 97, temperature 98.2 F (36.8 C), temperature source Oral, resp. rate 16, last menstrual period 05/02/2011, SpO2 100.00%. Physical Exam  Constitutional: She is oriented to person, place, and time. She appears well-developed.  HENT:  Head: Normocephalic and atraumatic.       Midline nasal septum  Eyes: Conjunctivae and EOM are normal. Pupils are equal, round, and reactive to light.  Neck: Normal range of motion. Neck supple.  Cardiovascular: Normal rate and regular rhythm.   Respiratory: Effort normal.  GI: Soft.  Neurological: She is alert and oriented to person, place, and time.    Assessment/Plan: Ct and MRI reviewed, pt for trans-septal trans-sphenoidal pituitary resection. Risks and benefits discussed with patient and mother.  Tanielle Emigh L 05/18/2011, 7:38 AM

## 2011-05-18 NOTE — Op Note (Signed)
05/18/2011  10:37 AM  PATIENT:  Barbette Merino  31 y.o. female  PRE-OPERATIVE DIAGNOSIS:  pituitary tumor bitemporal hemianopsia  POST-OPERATIVE DIAGNOSIS:  pituitary tumor bitemporal hemianopsia  PROCEDURE:  Procedure(s): CRANIOTOMY HYPOPHYSECTOMY TRANSNASAL APPROACH: Transsphenoidal resection of pituitary mass, harvesting of abdominal subcutaneous fat as an autograft material. ENDOSCOPIC SINUS SURGERY WITH STEALTH  SURGEON:  Surgeon(s): Hewitt Shorts, MD Barbee Cough, MD  ANESTHESIA:   general  EBL:  Total I/O In: 1400 [I.V.:1400] Out: 650 [Urine:650]  BLOOD ADMINISTERED:none  SPECIMEN: 1) Pituitary cyst fluid for Gram stain and culture.                      2) Pituitary cyst nodule and crystallized material  DICTATION: This procedure was done in a co-surgeon fashion between myself from the neurosurgical service and Dr. Osborn Coho from the ENT service. This is a dictation of the neurosurgical portion of the procedure. Dr. Annalee Genta will dictate the approach and closure, and I will dictate the tumor resection.  Patient was brought to the operating room placed under general endotracheal anesthesia. Patient was positioned with her head in a horseshoe headrest, with the head and neck gently flexed and the head gently tilted to the left. I set the C-arm fluoroscope to be able to visualize the sella intraoperatively. Dr. Annalee Genta set up the Stealth for intraoperative stereotactic navigation.  Dr. Annalee Genta performed the approach into the sphenoid sinus and visualized the sella, at which point I joined him, performing the tumor section with his assistance. The operating microscope had already been draped and we were able to visualize the sphenoid sinus and the anterior and inferior wall of the sella turcica.  Before beginning the tumor resection we harvested some subcutaneous fat from the right side of the abdominal wall. The abdomen had been prepped with DuraPrep. The  line incision was infiltrated with local anesthetic with epinephrine. Incision was made carried down into the subcutaneous tissue. Using sharp dissection we harvested small pieces of subcutaneous fat to be later used as an autograft implant. Electrocautery was used to establish hemostasis. And the wounds closed with interrupted inverted 2-0 and 3-0 undyed Vicryl sutures closing the subcutaneous and subcuticular layers. The skin is approximate Dermabond.  The anterior wall was intact and we used the high-speed drill to remove a small portion of the anterior wall, and then used overriding Kerrison punches to extend the bony opening. We then opened the dura in an X-shaped fashion. With that the anterior wall of the pituitary cyst was thin and we immediately saw a creamy whitish yellow cyst fluid drain into the field and we obtained aerobic and anaerobic cultures and requested a stat Gram stain. Subsequently the report of the Gram stain was no white blood cells and no organisms seen. The thick creamy whitish yellow cyst fluid drained under pressure and as it stopped draining then we began to see CSF drain into the field.  We then began to explore the cyst cavity itself. We obtained a small piece of the anterior wall as specimen. This allowed greater visualization of the cavity and there were a variety of abnormal-appearing tissues which were removed using a micro-ring curette including portions that appeared to be a nodule within the cyst as well as pale greenish gray crystallized material. All this specimen was sent to pathology in formalin for permanent section and evaluation.  Once all of the abnormal tissues within the cyst cavity had been removed we checked for hemostasis which  had been maintained throughout the procedure, and then placed a small piece of autograft fat in the cyst cavity. We then used a small piece of nasal bone which was cut to size and shape  And used to buttress the sella contents just  inside of the bony edges of the opening of the anterior wall of the sella turcica. We then placed dura foam over the bony opening. And injected DuraSeal over the dura foam to try and create a seal.  At this point the procedure was turned back over to Dr. Annalee Genta for closure. Following his closure we will place a lumbar drain which will be dictated as a separate dictation.  PLAN OF CARE: Admit to inpatient   PATIENT DISPOSITION:  PACU - hemodynamically stable.   Delay start of Pharmacological VTE agent (>24hrs) due to surgical blood loss or risk of bleeding:  yes

## 2011-05-18 NOTE — H&P (Signed)
Subjective: Patient is a 31 y.o. female who is admitted for treatment of a pituitary tumor. Patient symptoms developed about a month ago with altered vision and headache. 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 The headache has resolved. She does not describe any new endocrinologic symptoms. She is admitted now for transsphenoidal resection of pituitary mass and decompression of the optic chiasm.   Past Medical History  Diagnosis Date  . Headache     off and on for last month  . Vision changes     bilateral hemianopsia  . Pituitary tumor     Past Surgical History  Procedure Date  . Salivary gland surgery 08/09/2007    Prescriptions prior to admission  Medication Sig Dispense Refill  . drospirenone-ethinyl estradiol (YAZ,GIANVI,LORYNA) 3-0.02 MG tablet Take 1 tablet by mouth daily.       No Known Allergies  History  Substance Use Topics  . Smoking status: Never Smoker   . Smokeless tobacco: Not on file  . Alcohol Use: Yes     occasional    History reviewed. No pertinent family history.   Review of Systems A comprehensive review of systems was negative.  Objective: Vital signs in last 24 hours: Temp:  [98 F (36.7 C)-98.2 F (36.8 C)] 98.2 F (36.8 C) (02/12 0611) Pulse Rate:  [88-97] 97  (02/12 0611) Resp:  [16-20] 16  (02/12 0611) BP: (105-110)/(72-78) 105/72 mmHg (02/12 0611) SpO2:  [96 %-100 %] 100 % (02/12 0611) Weight:  [56.337 kg (124 lb 3.2 oz)] 56.337 kg (124 lb 3.2 oz) (02/11 1019)  EXAM: Patient is a well-developed well-nourished female in no acute distress. Lungs are clear to auscultation , the patient has symmetrical respiratory excursion. Heart has a regular rate and rhythm normal S1 and S2 no murmur.   Abdomen is soft nontender nondistended bowel sounds are present. Extremity examination shows no clubbing cyanosis or  edema. Mental status exam shows the patient is awake alert and fully oriented. Speech is fluent. She has good comprehension. Cranial nerves show pupils are 6 mm round and reactive to light. Extraocular movements are intact. Facial movement is symmetrical. Hearing is present bilaterally. Palatal movement is symmetrical. Shoulder shrug is symmetrical. Tongue is midline. Motor exam shows 5 over 5 strength in the upper and lower extremities. She has no drift of the upper extremities. Sensation is intact to pinprick in the upper and lower extremities. Reflexes are symmetrical. Toes are downgoing. She has normal gait and stance.  Data Review:CBC    Component Value Date/Time   WBC 7.8 05/17/2011 1102   RBC 4.62 05/17/2011 1102   HGB 13.8 05/17/2011 1102   HCT 41.7 05/17/2011 1102   PLT 336 05/17/2011 1102   MCV 90.3 05/17/2011 1102   MCH 29.9 05/17/2011 1102   MCHC 33.1 05/17/2011 1102   RDW 14.2 05/17/2011 1102   LYMPHSABS 3.8 05/17/2011 1102   MONOABS 0.6 05/17/2011 1102   EOSABS 0.1 05/17/2011 1102   BASOSABS 0.0 05/17/2011 1102                          BMET    Component Value Date/Time   NA 137 05/17/2011 1102   K 4.3 05/17/2011 1102   CL 100 05/17/2011 1102   CO2 25 05/17/2011 1102   GLUCOSE 77 05/17/2011 1102   BUN 8 05/17/2011 1102  CREATININE 0.68 05/17/2011 1102   CALCIUM 9.9 05/17/2011 1102   GFRNONAA >90 05/17/2011 1102   GFRAA >90 05/17/2011 1102     Assessment/Plan: Patient who presented with subacute apoplexy with vision alteration (bitemporal hemianopsia) and headache. She's been found to have a large pituitary mass with evidence of necrosis and intra-tumoral hemorrhage. There is significant compression of the optic chiasm.  Patient is admitted now for transsphenoidal resection of pituitary tumor to be done by Dr. Osborn Coho from the ENT service and myself for neurosurgical service. I discussed with the patient and her mother at length the nature of her condition, the nature the  surgical procedure, typical of surgery, ICU stay, hospital stay, and overall recuperation. We discussed risks of surgery include risk of infection, including that of meningitis and sinusitis, the risk of bleeding, possibly requiring transfusion, the risk of neurologic dysfunction including loss of vision, paralysis, coma, and death, the risks of endocrinologic dysfunction requiring long-term replacement, and anesthetic risks myocardial infarction, stroke, pneumonia, and death.  Understanding all of this the patient wishes to proceed with surgery and is admitted for such.   Hewitt Shorts, MD 05/18/2011 7:08 AM

## 2011-05-18 NOTE — Op Note (Signed)
05/18/2011  12:21 PM  PATIENT:  Marisa Galloway  31 y.o. female  PRE-OPERATIVE DIAGNOSIS: CSF drainage encountered during transphenoidal resection of pituitary mass  POST-OPERATIVE DIAGNOSIS:  CSF drainage a counter during transphenoidal resection of pituitary mass  PROCEDURE:  Procedure(s): Placement of lumbar drain  SURGEON:  Hewitt Shorts M.D.  ANESTHESIA:   general  DICTATION: Patient had just undergone a transphenoidal resection of the pituitary mass. A decision had been made to place a lumbar drain. The patient was turned to a left side down lateral decubitus position. The lumbar region was prepped with a DuraPrep and draped in a sterile fashion. Using a Touhy needle we were able to puncture the lumbar cistern. The lumbar drainage catheter was passed through the Touhy needle and good CSF drainage was noted from the end of the catheter. The needle was gently withdrawn maintain the position of the catheter. And then the catheter was connected to a closed collection system. Where continued good drainage was noted. The CSF was clear. The catheter was secured to the skin extending around to the right flank with sterile dressings and tape.   Hewitt Shorts, MD 05/18/11 1227

## 2011-05-18 NOTE — Progress Notes (Signed)
Pt. Arrived and after oral airway out, pt. Not moving air, bagged per CRNA, 1/4 cc narcan given , 5mg  labetalol per DR. Massagee., pt. Now breathing on own, with airway in place per CRNA.

## 2011-05-18 NOTE — Progress Notes (Signed)
Subjective:  Patient resting comfortably in bed. Drip pad has not required being changed since transferred from the PACU to ICU. Asking to have ice chips, denies any nausea. Lumbar drain is draining well. CSF is pink tinged. Urine output 200-350 cc, specific gravity 3 hours ago was 1.006. Postoperative BMET shows sodium 137 potassium 4.0.  Objective: Vital signs in last 24 hours: Filed Vitals:   05/18/11 1500 05/18/11 1600 05/18/11 1603 05/18/11 1700  BP: 129/92 118/96  117/80  Pulse: 67 67  68  Temp:   97.5 F (36.4 C)   TempSrc:   Oral   Resp: 13 15  8   Height:      Weight:      SpO2: 100% 100%  99%    Intake/Output from previous day:   Intake/Output this shift: Total I/O In: 2950 [I.V.:2950] Out: 1967 [Urine:1780; Drains:87; Blood:100]  Physical Exam:  Awake and alert, fully oriented. Following commands briskly. Pupils are equal round and slight. A short movements intact. Facial movements symmetrical. Hearing present. With glasses vision is good able to read the time as well the counter on the far wall and she feels that the vision is at least as good as prior to surgery. Moving all 4 extremities well.  CBC  Basename 05/17/11 1102  WBC 7.8  HGB 13.8  HCT 41.7  PLT 336   BMET  Basename 05/18/11 1453 05/17/11 1102  NA 137 137  K 4.0 4.3  CL 104 100  CO2 20 25  GLUCOSE 172* 77  BUN 5* 8  CREATININE 0.52 0.68  CALCIUM 8.1* 9.9   Studies/Results: Dg Sella Turcica  05/18/2011  *RADIOLOGY REPORT*  Clinical Data: Pituitary tumor  SELLA TURCICA  Comparison: None.  Findings: Lateral C-arm examination of the skull demonstrates a transsphenoidal probe in the nasal cavity. The floor of the sella is intact at this point.  IMPRESSION: As above.  Original Report Authenticated By: Elsie Stain, M.D.    Assessment/Plan: Patient stable following transoral resection of pituitary tumor today. No evidence of diabetes insipidus at this time. No evidence of CSF rhinorrhea at this  time. We'll continue lumbar drainage and allow patient to have ice chips.   Hewitt Shorts, MD 05/18/2011, 5:36 PM

## 2011-05-19 ENCOUNTER — Encounter (HOSPITAL_COMMUNITY): Payer: Self-pay | Admitting: Neurosurgery

## 2011-05-19 LAB — BASIC METABOLIC PANEL
BUN: 4 mg/dL — ABNORMAL LOW (ref 6–23)
CO2: 25 mEq/L (ref 19–32)
Calcium: 8.9 mg/dL (ref 8.4–10.5)
Chloride: 104 mEq/L (ref 96–112)
Creatinine, Ser: 0.56 mg/dL (ref 0.50–1.10)
GFR calc Af Amer: >90 mL/min (ref >90)
GFR calc non Af Amer: >90 mL/min (ref >90)
Glucose, Bld: 166 mg/dL — ABNORMAL HIGH (ref 70–99)
Potassium: 3.4 mEq/L — ABNORMAL LOW (ref 3.5–5.1)
Sodium: 138 mEq/L (ref 135–145)

## 2011-05-19 LAB — CBC
HCT: 36.2 % (ref 36.0–46.0)
Hemoglobin: 12.2 g/dL (ref 12.0–15.0)
MCH: 29.8 pg (ref 26.0–34.0)
MCHC: 33.7 g/dL (ref 30.0–36.0)
MCV: 88.5 fL (ref 78.0–100.0)
Platelets: 292 10*3/uL (ref 150–400)
RBC: 4.09 MIL/uL (ref 3.87–5.11)
RDW: 13.9 % (ref 11.5–15.5)
WBC: 14 10*3/uL — ABNORMAL HIGH (ref 4.0–10.5)

## 2011-05-19 LAB — DIFFERENTIAL
Basophils Absolute: 0 10*3/uL (ref 0.0–0.1)
Basophils Relative: 0 % (ref 0–1)
Eosinophils Absolute: 0 10*3/uL (ref 0.0–0.7)
Eosinophils Relative: 0 % (ref 0–5)
Lymphocytes Relative: 10 % — ABNORMAL LOW (ref 12–46)
Lymphs Abs: 1.5 10*3/uL (ref 0.7–4.0)
Monocytes Absolute: 0.7 10*3/uL (ref 0.1–1.0)
Monocytes Relative: 5 % (ref 3–12)
Neutro Abs: 11.8 10*3/uL — ABNORMAL HIGH (ref 1.7–7.7)
Neutrophils Relative %: 84 % — ABNORMAL HIGH (ref 43–77)

## 2011-05-19 LAB — POCT URINE SPECIFIC GRAVITY, REFRACTOMETER
Specific gravity, refractometer-urine: 1.0148 (ref 1.001–1.035)
Specific gravity, refractometer-urine: 1.0148 (ref 1.001–1.035)
Specific gravity, refractometer-urine: 1.0066 (ref 1.001–1.035)
Specific gravity, refractometer-urine: 1.0037 (ref 1.001–1.035)
Specific gravity, refractometer-urine: 1.018 (ref 1.001–1.035)
Specific gravity, refractometer-urine: 1.018 (ref 1.001–1.035)

## 2011-05-19 MED ORDER — NAPROXEN 500 MG PO TABS
500.0000 mg | ORAL_TABLET | Freq: Two times a day (BID) | ORAL | Status: DC
  Administered 2011-05-19 – 2011-05-25 (×13): 500 mg via ORAL
  Filled 2011-05-19 (×17): qty 1

## 2011-05-19 MED ORDER — KCL IN DEXTROSE-NACL 20-5-0.45 MEQ/L-%-% IV SOLN
INTRAVENOUS | Status: DC
  Administered 2011-05-19 – 2011-05-24 (×4): via INTRAVENOUS
  Filled 2011-05-19 (×13): qty 1000

## 2011-05-19 MED ORDER — ZOLPIDEM TARTRATE 5 MG PO TABS
10.0000 mg | ORAL_TABLET | Freq: Every evening | ORAL | Status: DC | PRN
  Administered 2011-05-19 – 2011-05-23 (×2): 10 mg via ORAL
  Filled 2011-05-19 (×2): qty 2

## 2011-05-19 MED ORDER — HYDROCORTISONE 20 MG PO TABS
30.0000 mg | ORAL_TABLET | Freq: Once | ORAL | Status: AC
  Administered 2011-05-19: 30 mg via ORAL
  Filled 2011-05-19: qty 1

## 2011-05-19 MED ORDER — DOCUSATE SODIUM 100 MG PO CAPS
100.0000 mg | ORAL_CAPSULE | Freq: Two times a day (BID) | ORAL | Status: DC
  Administered 2011-05-19 – 2011-05-24 (×9): 100 mg via ORAL
  Filled 2011-05-19 (×15): qty 1

## 2011-05-19 MED ORDER — HYDROCORTISONE 5 MG PO TABS
15.0000 mg | ORAL_TABLET | Freq: Once | ORAL | Status: AC
  Administered 2011-05-19: 15 mg via ORAL
  Filled 2011-05-19: qty 1

## 2011-05-19 MED ORDER — DIAZEPAM 5 MG PO TABS
5.0000 mg | ORAL_TABLET | Freq: Four times a day (QID) | ORAL | Status: DC | PRN
  Administered 2011-05-19 – 2011-05-23 (×11): 5 mg via ORAL
  Filled 2011-05-19 (×11): qty 1

## 2011-05-19 MED ORDER — DROSPIRENONE-ETHINYL ESTRADIOL 3-0.02 MG PO TABS
1.0000 | ORAL_TABLET | Freq: Every day | ORAL | Status: DC
  Administered 2011-05-19 – 2011-05-24 (×6): 1 via ORAL
  Filled 2011-05-19: qty 1

## 2011-05-19 MED ORDER — HYDROCORTISONE 20 MG PO TABS
20.0000 mg | ORAL_TABLET | Freq: Every day | ORAL | Status: AC
  Administered 2011-05-20 – 2011-05-21 (×2): 20 mg via ORAL
  Filled 2011-05-19 (×3): qty 1

## 2011-05-19 MED ORDER — HYDROCORTISONE 10 MG PO TABS
10.0000 mg | ORAL_TABLET | Freq: Every day | ORAL | Status: AC
  Administered 2011-05-20 – 2011-05-21 (×2): 10 mg via ORAL
  Filled 2011-05-19 (×2): qty 1

## 2011-05-19 NOTE — Progress Notes (Signed)
Subjective:  Patient sitting up in bed, comfortable, no significant headache. Hungry and asking for by mouth's. Patient feels vision it is improved as compared to prior surgery. Urine output 125-275 cc per hour. Specific gravity 1.006-1.010. Lumbar drain is working well.  Objective: Vital signs in last 24 hours: Filed Vitals:   05/19/11 0500 05/19/11 0600 05/19/11 0700 05/19/11 0800  BP: 123/87 115/78 112/74 110/78  Pulse: 90 69 71 74  Temp:   98.2 F (36.8 C)   TempSrc:   Oral   Resp: 15 10 10 18   Height:      Weight:  60 kg (132 lb 4.4 oz)    SpO2: 100% 100% 100% 100%    Intake/Output from previous day: 02/12 0701 - 02/13 0700 In: 4350 [I.V.:4350] Out: 4382 [Urine:3945; Drains:337; Blood:100] Intake/Output this shift:    Physical Exam:  Awake, alert, oriented. Extraocular movements are intact. Vision good.  CBC  Basename 05/19/11 0358 05/17/11 1102  WBC 14.0* 7.8  HGB 12.2 13.8  HCT 36.2 41.7  PLT 292 336   BMET  Basename 05/19/11 0358 05/18/11 1453  NA 138 137  K 3.4* 4.0  CL 104 104  CO2 25 20  GLUCOSE 166* 172*  BUN 4* 5*  CREATININE 0.56 0.52  CALCIUM 8.9 8.1*    Assessment/Plan: Patient doing well following transsphenoidal resection of pituitary tumor. No evidence of DI at this time. Pharmacy did not continue hydrocortisone drip after initial bag of IVF despite order to do so. We'll start on hydrocortisone by mouth. We'll begin clear liquids and advanced to soft diet. We'll decrease of IVF. We'll leave Foley and to closely monitor eyes nose and specific gravities. We'll continue lumbar drain probably until the 18th.   Hewitt Shorts, MD 05/19/2011, 8:34 AM

## 2011-05-19 NOTE — Plan of Care (Signed)
Problem: Consults Goal: Diagnosis - Craniotomy Outcome: Completed/Met Date Met:  05/19/11 Pituitary tumor removal

## 2011-05-19 NOTE — Op Note (Signed)
[name], Marisa Galloway              ACCOUNT NO.:  [id number]  MEDICAL RECORD NO.:  [id number]  LOCATION:  3105                         FACILITY:  [facility]  PHYSICIAN:  Kinnie Scales. Annalee Genta, M.D.DATE OF BIRTH:  08-24-1980  DATE OF PROCEDURE:  05/18/2011 DATE OF DISCHARGE:                              OPERATIVE REPORT   PREOPERATIVE DIAGNOSIS:  __________ pituitary mass.  POSTOPERATIVE DIAGNOSIS:  __________ pituitary mass.  PROCEDURE:  Transseptal transsphenoidal approach to pituitary tumor.  SURGEON:  Kinnie Scales. Annalee Genta, MD  NEUROSURGEON:  Hewitt Shorts, MD  ANESTHESIA:  General endotracheal.  COMPLICATIONS:  None.  ESTIMATED BLOOD LOSS:  Minimal.  The patient was transferred from the operating room to recovery room in stable condition.  Please note, __________ placement of lumbar drain dictated in separate operative report by Dr. Newell Coral.  BRIEF HISTORY:  The patient is a healthy 31 year old black female who presented to her ophthalmologist with a history of visual change. Examination including visual field assessment showed bilateral hemianopsia.  The patient underwent an MRI scan which showed a pituitary mass with large __________ component.  The patient complained of mild headache.  Vision loss was stable.  __________.  Based on her history, examination and MRI findings, she was scheduled to undergo pituitary resection via transseptal transsphenoidal approach __________ sinuses for evaluation of __________.  DESCRIPTION OF PROCEDURE:  The patient was brought to the operating room and placed in supine position on the operating table.  General endotracheal anesthesia was established without difficulty.  The patient was adequately anesthetized and positioned on the operating table, prepped and draped in sterile fashion.  Her nose was then injected __________ in a submucosal fashion __________ cartilage was carefully dissected with __________.  After decompression of  pituitary cysts and removal of abnormal-looking tissue __________ reapproximated the septal flaps with 4-0 gut suture on a Keith needle in a horizontal mattress fashion.  The septal flaps were reapproximated posterior/anterior and brought to a good midline position.  Anterior hemitransfixion incision was closed with the same stitch.  The anterior columella of the nasal septum was reapproximated to the maxillary crest  using 4-0 PDS suture in interrupted fashion. The subcutaneous tissue was closed with 4-0 Vicryl suture and final skin edge was closed with __________ good anatomic position.  Flaps reapproximated and bilateral Doyle nasal septal splints were placed after the application of Bactroban ointment and sutured into position with a 3-0 Ethilon suture.  Bilateral 8 cm Merocel sponges were placed one in each nostril, again after the application of Bactroban ointment and hydrated with sterile saline.  The patient was awakened from anesthetic.  Prior to extubation, a lumbar drain was placed by Dr. __________ dictated as a separate operative procedure.  The patient was extubated and transferred from the operating room __________.          ______________________________ Kinnie Scales. Annalee Genta, M.D.     DLS/MEDQ  D:  16/01/9603  T:  05/19/2011  Job:  [id number]

## 2011-05-19 NOTE — Progress Notes (Signed)
   ENT Progress Note: POD #1 s/p Procedure(s): CRANIOTOMY HYPOPHYSECTOMY TRANSNASAL APPROACH  WITH STEALTH FLUOROSCOPY GUIDED LUMBAR PUNCTURE DRAIN CSF   Subjective: Pt stable c/o nasal cong and mild h/a  Objective: Vital signs in last 24 hours: Temp:  [97 F (36.1 C)-98.5 F (36.9 C)] 98.5 F (36.9 C) (02/13 0400) Pulse Rate:  [67-119] 71  (02/13 0700) Resp:  [8-36] 10  (02/13 0700) BP: (107-137)/(68-97) 112/74 mmHg (02/13 0700) SpO2:  [99 %-100 %] 100 % (02/13 0700) Arterial Line BP: (131-158)/(75-88) 144/76 mmHg (02/13 0700) Weight:  [60 kg (132 lb 4.4 oz)-61.2 kg (134 lb 14.7 oz)] 60 kg (132 lb 4.4 oz) (02/13 0600) Weight change:     Intake/Output from previous day: 02/12 0701 - 02/13 0700 In: 4350 [I.V.:4350] Out: 4382 [Urine:3945; Drains:337; Blood:100] Intake/Output this shift:    Labs:  [facility] 05/19/11 0358 05/17/11 1102  WBC 14.0* 7.8  HGB 12.2 13.8  HCT 36.2 41.7  PLT 292 336    Basename 05/19/11 0358 05/18/11 1453  NA 138 137  K 3.4* 4.0  CL 104 104  CO2 25 20  GLUCOSE 166* 172*  BUN 4* 5*  CALCIUM 8.9 8.1*    Studies/Results: Dg Sella Turcica  05/18/2011  *RADIOLOGY REPORT*  Clinical Data: Pituitary tumor  SELLA TURCICA  Comparison: None.  Findings: Lateral C-arm examination of the skull demonstrates a transsphenoidal probe in the nasal cavity. The floor of the sella is intact at this point.  IMPRESSION: As above.  Original Report Authenticated By: Elsie Stain, M.D.     PHYSICAL EXAM: A&O x3 EOMI, vision stable Packing and splints in-place   Assessment/Plan: Stable postop Cont lumbar drain, no active CSFLeak at this time. Advance diet. Packing in place until 2/18.    Marisa Galloway L 05/19/2011, 7:36 AM

## 2011-05-19 NOTE — Progress Notes (Signed)
Utilization review completed. Kinlie Janice, RN, BSN. 05/19/11  

## 2011-05-20 LAB — POCT URINE SPECIFIC GRAVITY, REFRACTOMETER
Specific gravity, refractometer-urine: 1.018 (ref 1.001–1.035)
Specific gravity, refractometer-urine: 1.0285 (ref 1.001–1.035)
Specific gravity, refractometer-urine: 1.0185 (ref 1.001–1.035)

## 2011-05-20 MED ORDER — PANTOPRAZOLE SODIUM 40 MG PO TBEC
40.0000 mg | DELAYED_RELEASE_TABLET | Freq: Every day | ORAL | Status: DC
  Administered 2011-05-21 – 2011-05-24 (×4): 40 mg via ORAL
  Filled 2011-05-20 (×4): qty 1

## 2011-05-20 NOTE — Progress Notes (Signed)
Pt followed for progression of care as a benefit of [employer] insurance. Pt will receive a post d/c transition of care call to assess recovery and any needs. Contact and program information given. Will follow up post d/c.  Melane Windholz C. Kaiana Marion, RN, CCM, [facility], [facility] Care Management program, # [phone].

## 2011-05-20 NOTE — Progress Notes (Signed)
Subjective:  Patient sitting up, her neck is more comfortable today. Eating and drinking adequately. Urine output has been stable and urine looks concentrated. Lumbar drain continued to drain well.  Objective: Vital signs in last 24 hours: Filed Vitals:   05/20/11 0400 05/20/11 0500 05/20/11 0600 05/20/11 0700  BP:  109/35    Pulse: 84 80 87   Temp: 99 F (37.2 C)   98.6 F (37 C)  TempSrc: Oral   Oral  Resp: 13 12 16    Height:      Weight:      SpO2: 99% 100% 100%     Intake/Output from previous day: 02/13 0701 - 02/14 0700 In: 2135 [P.O.:460; I.V.:1675] Out: 2150 [Urine:1710; Drains:440] Intake/Output this shift: Total I/O In: 150 [IV Piggyback:150] Out: 50 [Drains:50]  Physical Exam:  Awake and alert, oriented. Vision is good. Moving all extremity is well.   Assessment/Plan: We'll change IV to saline lock, begin to ambulate in the halls, change protonix to by mouth, and check BMET in a.m.   Hewitt Shorts, MD 05/20/2011, 10:55 AM

## 2011-05-21 LAB — BODY FLUID CULTURE
Culture: NO GROWTH
Gram Stain: NONE SEEN

## 2011-05-21 LAB — BASIC METABOLIC PANEL WITH GFR
BUN: 9 mg/dL (ref 6–23)
CO2: 28 meq/L (ref 19–32)
Calcium: 9.1 mg/dL (ref 8.4–10.5)
Chloride: 102 meq/L (ref 96–112)
Creatinine, Ser: 0.64 mg/dL (ref 0.50–1.10)
GFR calc Af Amer: >90 mL/min
GFR calc non Af Amer: >90 mL/min
Glucose, Bld: 100 mg/dL — ABNORMAL HIGH (ref 70–99)
Potassium: 3.4 meq/L — ABNORMAL LOW (ref 3.5–5.1)
Sodium: 138 meq/L (ref 135–145)

## 2011-05-21 LAB — POCT URINE SPECIFIC GRAVITY, REFRACTOMETER
Specific gravity, refractometer-urine: 1.0061 (ref 1.001–1.035)
Specific gravity, refractometer-urine: 1.0225 (ref 1.001–1.035)

## 2011-05-21 MED ORDER — HYDROCORTISONE 5 MG PO TABS
5.0000 mg | ORAL_TABLET | Freq: Every day | ORAL | Status: DC
  Administered 2011-05-22: 18:00:00 via ORAL
  Administered 2011-05-23: 5 mg via ORAL
  Filled 2011-05-21 (×3): qty 1

## 2011-05-21 MED ORDER — HYDROCORTISONE 10 MG PO TABS
10.0000 mg | ORAL_TABLET | Freq: Every day | ORAL | Status: DC
  Administered 2011-05-22 – 2011-05-23 (×2): 10 mg via ORAL
  Filled 2011-05-21 (×4): qty 1

## 2011-05-21 MED ORDER — BISACODYL 5 MG PO TBEC
10.0000 mg | DELAYED_RELEASE_TABLET | Freq: Once | ORAL | Status: AC
  Administered 2011-05-21: 10 mg via ORAL
  Filled 2011-05-21: qty 2

## 2011-05-21 NOTE — Progress Notes (Signed)
Case Management 05/21/11- 1000 - Spoke with patient and family on 05/20/11. No needs identified at present. Will follow.

## 2011-05-21 NOTE — Progress Notes (Signed)
   ENT Progress Note: POD #3 s/p Procedure(s): CRANIOTOMY HYPOPHYSECTOMY TRANSNASAL APPROACH   WITH STEALTH FLUOROSCOPY GUIDED LUMBAR PUNCTURE DRAIN CSF   Subjective: Stable, C/O of stiff neck   Objective: Vital signs in last 24 hours: Temp:  [97.9 F (36.6 C)-99.5 F (37.5 C)] 98.4 F (36.9 C) (02/15 0700) Pulse Rate:  [66-87] 79  (02/15 0800) Resp:  [11-16] 12  (02/15 0800) BP: (108-147)/(64-89) 112/64 mmHg (02/15 0800) SpO2:  [100 %] 100 % (02/15 0800) Weight change:  Last BM Date: 05/17/11  Intake/Output from previous day: 02/14 0701 - 02/15 0700 In: 1108.8 [P.O.:720; I.V.:238.8; IV Piggyback:150] Out: 1917 [Urine:1507; Drains:410] Intake/Output this shift: Total I/O In: 240 [P.O.:240] Out: 97 [Urine:35; Drains:62]  Labs:  [facility] 05/19/11 0358  WBC 14.0*  HGB 12.2  HCT 36.2  PLT 292    Basename 05/21/11 0500 05/19/11 0358  NA 138 138  K 3.4* 3.4*  CL 102 104  CO2 28 25  GLUCOSE 100* 166*  BUN 9 4*  CALCIUM 9.1 8.9    Studies/Results: No results found.   PHYSICAL EXAM: EOMI Min nasal drainage Packing in place   Assessment/Plan: Cont current care Plan packing removal 2/18    Elajah Kunsman L 05/21/2011, 12:04 PM

## 2011-05-21 NOTE — Progress Notes (Signed)
Subjective:  Patient reasonably comfortable. Still some bloody drainage from from her nose. Lumbar drain was not draining much overnight, adjustments made this morning including raising out of bed, raising bed, and lowering drain, increased flow immediately. Nursing staff instructed regarding proper positioning of the lumbar drain to facilitate adequate lumbar drainage. No evidence of diabetes insipidus since surgery. Specific gravities normal.  Objective: Vital signs in last 24 hours: Filed Vitals:   05/21/11 0400 05/21/11 0500 05/21/11 0600 05/21/11 0700  BP:  119/84  112/64  Pulse: 67 70 68 84  Temp: 98.4 F (36.9 C)     TempSrc: Oral     Resp: 11 11 11 12   Height:      Weight:      SpO2: 100% 100% 100% 100%    Intake/Output from previous day: 02/14 0701 - 02/15 0700 In: 1108.8 [P.O.:720; I.V.:238.8; IV Piggyback:150] Out: 1917 [Urine:1507; Drains:410] Intake/Output this shift:    Physical Exam:  Awake alert oriented. Moving all extremities well. Vision is good.  BMET  Basename 05/21/11 0500 05/19/11 0358  NA 138 138  K 3.4* 3.4*  CL 102 104  CO2 28 25  GLUCOSE 100* 166*  BUN 9 4*  CREATININE 0.64 0.56  CALCIUM 9.1 8.9    Assessment/Plan: Stable following transfer to resection of pituitary tumor. No evidence of diabetes insipidus. Encouraged to increase ambulation. Will DC Foley catheter, and urine specific gravities. We'll check BMET on Monday, February 18th.  Will begin further taper had hydrocortisone in a.m. Will leave lumbar drain in until Monday, February 18th.   Hewitt Shorts, MD 05/21/2011, 7:54 AM

## 2011-05-22 NOTE — Progress Notes (Signed)
Subjective: Patient reports That she's doing very well no significant headache vision significantly improved she has some neck pain but no nausea vomiting  Objective: Vital signs in last 24 hours: Temp:  [97.4 F (36.3 C)-98.7 F (37.1 C)] 97.8 F (36.6 C) (02/16 0400) Pulse Rate:  [57-92] 72  (02/16 0700) Resp:  [9-22] 13  (02/16 0700) BP: (103-122)/(64-79) 103/70 mmHg (02/16 0400) SpO2:  [99 %-100 %] 100 % (02/16 0700)  Intake/Output from previous day: 02/15 0701 - 02/16 0700 In: 840 [P.O.:840] Out: 1540 [Urine:1085; Drains:455] Intake/Output this shift:    Neurologically intact and nonfocal  Lab Results: No results found for this basename: WBC:2,HGB:2,HCT:2,PLT:2 in the last 72 hours BMET  Basename 05/21/11 0500  NA 138  K 3.4*  CL 102  CO2 28  GLUCOSE 100*  BUN 9  CREATININE 0.64  CALCIUM 9.1    Studies/Results: No results found.  Assessment/Plan: Doing well status post transsphenoidal hypophysectomy with a CSF leak and lumbar drain placement. Drain output seems to be adequate continued drainage over the weekend.  LOS: 4 days     Tramell Piechota P 05/22/2011, 7:42 AM

## 2011-05-23 LAB — ANAEROBIC CULTURE: Gram Stain: NONE SEEN

## 2011-05-23 NOTE — Progress Notes (Signed)
Subjective: Patient reports She's feeling overall much better she has no headache she is having some neck pain she's had minimal to no drainage of her nose and her dermal motor and sensory functioning well  Objective: Vital signs in last 24 hours: Temp:  [96.9 F (36.1 C)-98.6 F (37 C)] 97.6 F (36.4 C) (02/16 2345) Pulse Rate:  [83] 83  (02/16 2000) Resp:  [10-13] 10  (02/17 0000) BP: (102-123)/(72-83) 123/78 mmHg (02/17 0800) SpO2:  [98 %-100 %] 100 % (02/17 0800)  Intake/Output from previous day: 02/16 0701 - 02/17 0700 In: 1195 [P.O.:1145; IV Piggyback:50] Out: 1811 [Urine:1400; Drains:410; Stool:1] Intake/Output this shift: Total I/O In: -  Out: 34 [Drains:34]  Visit is improved neurologically normal.  Lab Results: No results found for this basename: WBC:2,HGB:2,HCT:2,PLT:2 in the last 72 hours BMET  Basename 05/21/11 0500  NA 138  K 3.4*  CL 102  CO2 28  GLUCOSE 100*  BUN 9  CREATININE 0.64  CALCIUM 9.1    Studies/Results: No results found.  Assessment/Plan: Postop transsphenoidal hypophysectomy with CSF leak and lumbar drain placement. Continue lumbar drain one more day at minimum to discontinue tomorrow.  LOS: 5 days     Eldrige Pitkin P 05/23/2011, 9:49 AM

## 2011-05-24 LAB — BASIC METABOLIC PANEL
BUN: 10 mg/dL (ref 6–23)
CO2: 29 mEq/L (ref 19–32)
Calcium: 9.4 mg/dL (ref 8.4–10.5)
Chloride: 100 mEq/L (ref 96–112)
Creatinine, Ser: 0.65 mg/dL (ref 0.50–1.10)
GFR calc Af Amer: >90 mL/min (ref >90)
GFR calc non Af Amer: >90 mL/min (ref >90)
Glucose, Bld: 74 mg/dL (ref 70–99)
Potassium: 4.2 mEq/L (ref 3.5–5.1)
Sodium: 137 mEq/L (ref 135–145)

## 2011-05-24 MED ORDER — HYDROCORTISONE 5 MG PO TABS
5.0000 mg | ORAL_TABLET | Freq: Every day | ORAL | Status: DC
  Administered 2011-05-24 – 2011-05-25 (×2): 5 mg via ORAL
  Filled 2011-05-24 (×3): qty 1

## 2011-05-24 MED ORDER — SALINE SPRAY 0.65 % NA SOLN
4.0000 | NASAL | Status: DC | PRN
  Administered 2011-05-24 (×3): 4 via NASAL
  Filled 2011-05-24 (×2): qty 44

## 2011-05-24 MED ORDER — HYDROCORTISONE 5 MG/ML ORAL SUSPENSION
2.5000 mg | Freq: Every day | ORAL | Status: DC
  Administered 2011-05-24: 2.5 mg via ORAL
  Filled 2011-05-24 (×3): qty 0.5

## 2011-05-24 MED ORDER — HYDROCORTISONE 5 MG PO TABS
2.5000 mg | ORAL_TABLET | Freq: Every day | ORAL | Status: DC
Start: 2011-05-24 — End: 2011-05-24
  Filled 2011-05-24: qty 1

## 2011-05-24 NOTE — Progress Notes (Signed)
Subjective:  Patient sitting up in chair, without complaints. Has been eating fairly well. Lumbar drain has been working well. Tolerated hydrocortisone taper over the weekend without difficulty.  Objective: Vital signs in last 24 hours: Filed Vitals:   05/23/11 2000 05/23/11 2331 05/23/11 2354 05/24/11 0400  BP: 111/77  100/69 114/74  Pulse:   68 61  Temp:  97.5 F (36.4 C)  97.6 F (36.4 C)  TempSrc:  Oral    Resp: 10  11 14   Height:      Weight:      SpO2: 100%  99% 99%    Intake/Output from previous day: 02/17 0701 - 02/18 0700 In: 1590 [P.O.:1540; IV Piggyback:50] Out: 705 [Urine:250; Drains:455] Intake/Output this shift:    Physical Exam:  Awake, alert, oriented. Moving all short as well. Vision good.  BMET  Basename 05/24/11 0442  NA 137  K 4.2  CL 100  CO2 29  GLUCOSE 74  BUN 10  CREATININE 0.65  CALCIUM 9.4    Assessment/Plan: Patient continues to do well. No evidence of diabetes insipidus. Chemistries good. Lumbar drain removed without difficulty, sterile dressing applied. We'll continue hydrocortisone taper, orders written. Nasal packings to be removed by Dr. Annalee Genta today. Encouraged to ambulate actively and ICU.   Hewitt Shorts, MD 05/24/2011, 7:48 AM

## 2011-05-24 NOTE — Progress Notes (Signed)
   ENT Progress Note: POD #6  s/p Procedure(s): TRANS-NASAL PITUITARY RESECTION FLUOROSCOPY GUIDED LUMBAR PUNCTURE DRAIN CSF   Subjective: Stable, no H/A   Objective: Vital signs in last 24 hours: Temp:  [97 F (36.1 C)-98 F (36.7 C)] 97 F (36.1 C) (02/18 0700) Pulse Rate:  [61-90] 90  (02/18 0915) Resp:  [10-14] 13  (02/18 0915) BP: (100-114)/(61-78) 108/78 mmHg (02/18 0915) SpO2:  [99 %-100 %] 100 % (02/18 0915) Weight change:  Last BM Date: 05/23/11  Intake/Output from previous day: 02/17 0701 - 02/18 0700 In: 1590 [P.O.:1540; IV Piggyback:50] Out: 705 [Urine:250; Drains:455] Intake/Output this shift: Total I/O In: 300 [P.O.:300] Out: 10 [Drains:10]  Labs: No results found for this basename: WBC:2,HGB:2,HCT:2,PLT:2 in the last 72 hours  Basename 05/24/11 0442  NA 137  K 4.2  CL 100  CO2 29  GLUCOSE 74  BUN 10  CALCIUM 9.4    Studies/Results: No results found.   PHYSICAL EXAM: Nasal packing removed, no bleeding or leakage Splints in-place, sutures intact   Assessment/Plan: Pt stable, packing removed Plan d/c per Dr. Dorris Carnes. F/U as outpt next week for splint and suture removal Saline spray and ointment to incision    Laketra Bowdish L 05/24/2011, 11:34 AM

## 2011-05-24 NOTE — Progress Notes (Signed)
Subjective:  Patient without complaints, sitting up watching TV. Has ambulated several times and ICU. Nasal packing is removed by Dr. Annalee Genta. No apparent rhinorrhea.  Objective: Vital signs in last 24 hours: Filed Vitals:   05/24/11 1000 05/24/11 1100 05/24/11 1200 05/24/11 1210  BP:    106/74  Pulse:    77  Temp:      TempSrc:      Resp: 10 13 15 14   Height:      Weight:      SpO2:    100%    Intake/Output from previous day: 02/17 0701 - 02/18 0700 In: 1590 [P.O.:1540; IV Piggyback:50] Out: 705 [Urine:250; Drains:455] Intake/Output this shift: Total I/O In: 300 [P.O.:300] Out: 10 [Drains:10]  Physical Exam:  Awake alert oriented. Moving all extremity well.  Assessment/Plan: Doing well so far after removal of lumbar drain and nasal packing. We'll continue to monitor for CSF leakage.   Hewitt Shorts, MD 05/24/2011, 2:49 PM

## 2011-05-25 NOTE — Discharge Summary (Signed)
Physician Discharge Summary  Patient ID: Marisa Galloway MRN: [id number] DOB/AGE: 1981/03/08 31 y.o.  Admit date: 05/18/2011 Discharge date: 05/25/2011  Admission Diagnoses: Pituitary tumor, bitemporal hemianopsia  Discharge Diagnoses: Pituitary tumor, bitemporal hemianopsia  Discharged Condition: good  Hospital Course: Patient was admitted underwent transsphenoidal resection of pituitary tumor by myself from the neurosurgical service and Dr. Osborn Coho from the ENT service. Intraoperatively the tumor was found to be cystic, and once the cyst fluid drained then CSF drained into the exposure. Therefore after the resection was completed and the exposure closed, a lumbar drain was placed. 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 Lumbar drain was discontinued on the sixth postoperative day, and she's had no evidence of rhinorrhea since the nasal packings were removed and the lumbar drain removed. She is up and ambulate actively in the ICU. She is comfortable. Her nasal incision is clean and dry and healing nicely.  She's been given instructions regarding wound care and activities following discharge. She is to return to Dr. Annalee Genta for suture removal and nasal splint removal next week and she is to return to my office in 2-3 weeks for followup.  Pathology report described a pituitary adenoma.  Discharge Exam: Blood pressure 111/71, pulse 77, temperature 98.5 F (36.9 C), temperature source Oral, resp. rate 14, height 5\' 3"  (1.6 m), weight 60 kg (132 lb 4.4 oz), last menstrual period 05/02/2011, SpO2 100.00%.   Disposition: Home   Medication List  As of 05/25/2011  7:32 AM   TAKE these medications         drospirenone-ethinyl estradiol 3-0.02 MG tablet   Commonly known as: YAZ,GIANVI,LORYNA   Take 1 tablet by mouth daily.              Signed: Hewitt Shorts, MD 05/25/2011, 7:32 AM

## 2011-05-25 NOTE — Progress Notes (Signed)
Patient being discharged home per Dr. Newell Coral. VSS, PIV removed, patient tolerating activity and diet. Patient's discharge instructions taught and understood per patient. Patient aware of current medications, no prescriptions necessary. Patient to make follow up appointments as written on discharge instructions.

## 2011-05-28 NOTE — Op Note (Signed)
Marisa Galloway, Marisa Galloway              ACCOUNT NO.:  [id number]  MEDICAL RECORD NO.:  [id number]  LOCATION:  3105                         FACILITY:  [facility]  PHYSICIAN:  Kinnie Scales. Annalee Genta, M.D.DATE OF BIRTH:  1980/11/27  DATE OF PROCEDURE:  05/18/2011 DATE OF DISCHARGE:  05/25/2011                              OPERATIVE REPORT   PREOPERATIVE DIAGNOSIS:  Cystic pituitary mass.  POSTOPERATIVE DIAGNOSIS:  Cystic pituitary mass.  SURGICAL PROCEDURE:  Transseptal transsphenoidal approach to pituitary tumor with CT-guided localization (Stealth).  SURGEON:  Kinnie Scales. Annalee Genta, MD  NEUROSURGEON:  Hewitt Shorts, MD  ANESTHESIA:  General endotracheal.  COMPLICATIONS:  None.  ESTIMATED BLOOD LOSS:  Less than 50 mL.  BRIEF HISTORY:  The patient was transferred from the operating room to recovery room in stable condition.  Please note that a resection of the pituitary tumor and placement of a lumbar drain is dictated as a separate operative report by Dr. Newell Coral.  BRIEF HISTORY:  The patient is a 31 year old black female who presented to her ophthalmologist with a history of visual changes.  Evaluation including visual field testing, showed that the patient to have bilateral hemianopsia.  The patient then underwent MRI scanning, which showed a pituitary mass with a large cystic component.  The patient had complaints of mild headache and visual change.  Headache has resolved. The patient was evaluated by Dr. Newell Coral, MRI scan reviewed, and referred to our office for evaluation of a transseptal transsphenoidal approach for resection of pituitary tumor.  Prior to surgery, the patient underwent a complete axial CT scan of the sinuses to evaluate for sinus disease/anatomy and also to facilitate intraoperative computer- assisted navigation Nurse, adult).  The risks and benefits of the procedure were discussed in detail with the patient and her mother.  They understood and concurred with our  plan for surgery, which is scheduled in the [facility] Neurosurgical OR Suite with anticipated admission to the Neurosurgical ICU.  DESCRIPTION OF PROCEDURE:  The patient was brought to the Neurosurgical Operating Room at [facility] and placed in supine position on the operating table.  General endotracheal anesthesia was established without difficulty.  When the patient was adequately anesthetized, she was positioned on the operating table and prepped and draped in a sterile fashion.  Her nose was then injected with a total of 8 mL of 1% lidocaine 1:100,000 solution of epinephrine was injected in submucosal fashion along the nasal septum, floor of the nose, anterior nasal columella.  The patient's nose then packed with Afrin-soaked cotton pledgets were left in place for approximately 10 minutes for vasoconstriction and hemostasis.  The Stealth navigation headgear was then applied, and anatomic and surgical landmarks were identified and confirmed.  A C-arm cross-table x-ray was also utilized to confirm proper localization of the sinus anatomy and pituitary tumor.  With the patient prepped and prepared for surgery, the operation was begun by creating a left anterior hemitransfixion incision.  This was carried through the mucosa, underlying submucosa, and a mucoperichondrial flap was elevated from anterior to posterior along the left-hand side.  The left floor of the nose was then also carefully dissected and this was communicated with the septal flap  anteriorly. The cartilaginous septum was crossed in the midline and a mucoperichondrial flap was elevated on the right-hand side, dissecting from anterior to posterior to level of the anterior face of the sphenoid sinus.  The intervening septal bone and cartilage were then resected. Mid-septal cartilage was reserved and was later used for reconstructive purposes.  The mucoperichondrial pocket was widely opened with  access gained and an anterior columellar incision was created in the anterior skin in the preexisting skin crease.  This was carried through the skin, underlying subcutaneous tissue, and the lower lateral nasal cartilages. The septal attachment to the maxillary crest was then carefully dissected and transected with curved scissors.  The septal flap was then elevated to the patient's right and the neurosurgical pituitary retractor was placed full length to the level of the anterior face of the sphenoid sinus.  The operating microscope was then moved into position.  The rostrum of the sphenoid sinus was carefully dissected, elevating the mucosa laterally to the level of the sphenoid sinus ostia. The patient had a severely curved sphenoid sinus septum.  This had been previously identified on the patient's CT scan.  An entrance was made directly into the large left sphenoid sinus and the intersinus septum was carefully dissected removing overlying mucosa using the combination of the Stealth navigation tool and the cross-table C-arm x-ray.  The anatomy of the sphenoid sinus and pituitary fossa was carefully identified.  With dissection carried out to the level of the pituitary gland, Dr. Newell Coral then proceeded with resection of the pituitary tumor, which is dictated as a separate operative report.  When the tumor resection was completed, reconstruction was undertaken.  After appropriate completion of the neurosurgical component of the pituitary resection, the nasal reconstruction was undertaken.  Beginning with the replacement of the previously resected mid-septal cartilage, which was morselized and then returned to the mucoperichondrial pocket. Mucoperichondrial flaps were reapproximated with a 4-0 gut suture on a Keith needle in a horizontal mattress fashion, and care was taken from posterior to anterior to reapproximate as much of the mucosal membranes as possible.  Anteriorly, the  hemitransfixion incision was closed with interrupted 4-0 gut suture.  The anterior floor of the nose and mucosal sutures then closed with 4-0 chromic in an interrupted fashion.  The anterior columellar septum was then reattached to the maxillary crest using a 4-0 PDS suture in an interrupted fashion.  The anterior columellar incision was then closed with interrupted 5-0 Vicryl sutures in the deep subcutaneous layer and 6-0 Ethilon suture along the skin margin.  The patient's nose was then carefully irrigated and suctioned. All mucosal and cutaneous incisions had been closed adequately, and the septum was in good midline position.  Bilateral Doyle nasal septal splints were then placed after the application of Bactroban ointment and were sutured into position with a 3-0 Ethilon suture.  Bilateral 8 cm Merocel nasal packing sponges were then placed bilaterally again after the application of Bactroban ointment, and these were hydrated with sterile saline.  The oral cavity and oropharynx were irrigated and suctioned.  An orogastric tube was passed and stomach contents were aspirated.  Prior to awakening the patient,  Dr. Newell Coral placed a lumbar drain to reduce the risk of a postoperative spinal fluid leak and again this was dictated as a separate operative report.  The patient was then awakened from anesthetic.  She was extubated and was transferred from the operating room to the recovery room in stable condition.  There were no complications.  Estimated blood loss for the nasal portion of the procedure was approximately 50 mL.          ______________________________ Kinnie Scales. Annalee Genta, M.D.     DLS/MEDQ  D:  09/81/1914  T:  05/28/2011  Job:  [id number]  cc:   Hewitt Shorts, M.D.

## 2011-06-21 ENCOUNTER — Other Ambulatory Visit (HOSPITAL_COMMUNITY): Payer: Self-pay | Admitting: Neurosurgery

## 2011-06-21 ENCOUNTER — Ambulatory Visit (HOSPITAL_COMMUNITY)
Admission: RE | Admit: 2011-06-21 | Discharge: 2011-06-21 | Disposition: A | Discharge status: Home / Self Care | Payer: 59 | Source: Ambulatory Visit | Attending: Neurosurgery | Admitting: Neurosurgery

## 2011-06-21 ENCOUNTER — Encounter (HOSPITAL_COMMUNITY): Payer: Self-pay

## 2011-06-21 DIAGNOSIS — R51 Headache: Secondary | ICD-10-CM

## 2011-06-21 DIAGNOSIS — D352 Benign neoplasm of pituitary gland: Secondary | ICD-10-CM | POA: Insufficient documentation

## 2011-06-21 DIAGNOSIS — D353 Benign neoplasm of craniopharyngeal duct: Secondary | ICD-10-CM

## 2011-08-03 ENCOUNTER — Other Ambulatory Visit (HOSPITAL_COMMUNITY): Payer: Self-pay | Admitting: Neurosurgery

## 2011-08-03 DIAGNOSIS — D352 Benign neoplasm of pituitary gland: Secondary | ICD-10-CM

## 2011-08-16 ENCOUNTER — Ambulatory Visit (HOSPITAL_COMMUNITY)
Admission: RE | Admit: 2011-08-16 | Discharge: 2011-08-16 | Disposition: A | Discharge status: Home / Self Care | Payer: 59 | Source: Ambulatory Visit | Attending: Neurosurgery | Admitting: Neurosurgery

## 2011-08-16 DIAGNOSIS — G939 Disorder of brain, unspecified: Secondary | ICD-10-CM | POA: Insufficient documentation

## 2011-08-16 DIAGNOSIS — D353 Benign neoplasm of craniopharyngeal duct: Secondary | ICD-10-CM

## 2011-08-16 DIAGNOSIS — Z09 Encounter for follow-up examination after completed treatment for conditions other than malignant neoplasm: Secondary | ICD-10-CM | POA: Insufficient documentation

## 2011-08-16 DIAGNOSIS — D352 Benign neoplasm of pituitary gland: Secondary | ICD-10-CM

## 2011-08-16 MED ORDER — GADOBENATE DIMEGLUMINE 529 MG/ML IV SOLN
15.0000 mL | Freq: Once | INTRAVENOUS | Status: AC | PRN
Start: 2011-08-16 — End: 2011-08-16
  Administered 2011-08-16: 11 mL via INTRAVENOUS

## 2011-08-21 ENCOUNTER — Other Ambulatory Visit (HOSPITAL_COMMUNITY): Payer: 59

## 2011-09-06 ENCOUNTER — Other Ambulatory Visit: Payer: Self-pay | Admitting: Neurosurgery

## 2011-09-16 ENCOUNTER — Encounter (HOSPITAL_COMMUNITY): Payer: Self-pay | Admitting: Pharmacy Technician

## 2011-09-23 ENCOUNTER — Encounter (HOSPITAL_COMMUNITY): Payer: Self-pay

## 2011-09-23 ENCOUNTER — Encounter (HOSPITAL_COMMUNITY)
Admission: RE | Admit: 2011-09-23 | Discharge: 2011-09-23 | Disposition: A | Discharge status: Home / Self Care | Payer: 59 | Source: Ambulatory Visit | Attending: Neurosurgery | Admitting: Neurosurgery

## 2011-09-23 HISTORY — DX: Other seasonal allergic rhinitis: J30.2

## 2011-09-23 LAB — TYPE AND SCREEN
ABO/RH(D): O POS
Antibody Screen: NEGATIVE

## 2011-09-23 LAB — SURGICAL PCR SCREEN
MRSA, PCR: NEGATIVE
Staphylococcus aureus: NEGATIVE

## 2011-09-23 LAB — CBC
HCT: 40.7 % (ref 36.0–46.0)
MCHC: 33.7 g/dL (ref 30.0–36.0)
MCV: 88.3 fL (ref 78.0–100.0)
RDW: 13.8 % (ref 11.5–15.5)

## 2011-09-23 NOTE — Pre-Procedure Instructions (Signed)
20 Marisa Galloway  09/23/2011   Your procedure is scheduled on:  Wed, June 26 @ 8:30 AM  Report to [facility] at 6:30 AM.  Call this number if you have problems the morning of surgery: [phone]   Remember:   Do not eat food:After Midnight.    Do not wear jewelry, make-up or nail polish.  Do not wear lotions, powders, or perfumes.   Do not shave 48 hours prior to surgery.   Do not bring valuables to the hospital.  Contacts, dentures or bridgework may not be worn into surgery.  Leave suitcase in the car. After surgery it may be brought to your room.  For patients admitted to the hospital, checkout time is 11:00 AM the day of discharge.   Patients discharged the day of surgery will not be allowed to drive home.    Special Instructions: CHG Shower Use Special Wash: 1/2 bottle night before surgery and 1/2 bottle morning of surgery.   Please read over the following fact sheets that you were given: Pain Booklet, Coughing and Deep Breathing, Blood Transfusion Information, MRSA Information and Surgical Site Infection Prevention

## 2011-09-23 NOTE — Progress Notes (Signed)
Pt doesn't have a cardiologist  Denies ever having an echo/heart cath/stress test  Medical md is dr.Robert anger [facility]  Denies having a cxr or ekg within last yr

## 2011-09-28 MED ORDER — CEFAZOLIN SODIUM 1-5 GM-% IV SOLN
1.0000 g | INTRAVENOUS | Status: DC
  Filled 2011-09-28: qty 50

## 2011-09-29 ENCOUNTER — Encounter (HOSPITAL_COMMUNITY): Payer: Self-pay | Admitting: Anesthesiology

## 2011-09-29 ENCOUNTER — Ambulatory Visit (HOSPITAL_COMMUNITY): Payer: 59 | Admitting: Anesthesiology

## 2011-09-29 ENCOUNTER — Encounter (HOSPITAL_COMMUNITY)
Admission: RE | Disposition: A | Discharge status: Home / Self Care | Payer: Self-pay | Source: Ambulatory Visit | Attending: Neurosurgery

## 2011-09-29 ENCOUNTER — Inpatient Hospital Stay (HOSPITAL_COMMUNITY)
Admission: RE | Admit: 2011-09-29 | Discharge: 2011-10-06 | DRG: 614 | Disposition: A | Discharge status: Home / Self Care | Payer: 59 | Source: Ambulatory Visit | Attending: Neurosurgery | Admitting: Neurosurgery

## 2011-09-29 ENCOUNTER — Encounter (HOSPITAL_COMMUNITY): Payer: Self-pay

## 2011-09-29 DIAGNOSIS — E232 Diabetes insipidus: Secondary | ICD-10-CM | POA: Diagnosis present

## 2011-09-29 DIAGNOSIS — D352 Benign neoplasm of pituitary gland: Principal | ICD-10-CM | POA: Diagnosis present

## 2011-09-29 DIAGNOSIS — Z01812 Encounter for preprocedural laboratory examination: Secondary | ICD-10-CM

## 2011-09-29 DIAGNOSIS — H5347 Heteronymous bilateral field defects: Secondary | ICD-10-CM | POA: Diagnosis present

## 2011-09-29 DIAGNOSIS — D353 Benign neoplasm of craniopharyngeal duct: Principal | ICD-10-CM | POA: Diagnosis present

## 2011-09-29 HISTORY — PX: CRANIOTOMY: SHX93

## 2011-09-29 LAB — BASIC METABOLIC PANEL
BUN: 7 mg/dL (ref 6–23)
BUN: 5 mg/dL — ABNORMAL LOW (ref 6–23)
BUN: 5 mg/dL — ABNORMAL LOW (ref 6–23)
CO2: 20 mEq/L (ref 19–32)
CO2: 17 mEq/L — ABNORMAL LOW (ref 19–32)
CO2: 19 mEq/L (ref 19–32)
Calcium: 7.8 mg/dL — ABNORMAL LOW (ref 8.4–10.5)
Calcium: 7.2 mg/dL — ABNORMAL LOW (ref 8.4–10.5)
Calcium: 9.2 mg/dL (ref 8.4–10.5)
Chloride: 109 mEq/L (ref 96–112)
Chloride: 124 mEq/L — ABNORMAL HIGH (ref 96–112)
Chloride: 116 mEq/L — ABNORMAL HIGH (ref 96–112)
Creatinine, Ser: 0.6 mg/dL (ref 0.50–1.10)
Creatinine, Ser: 0.5 mg/dL (ref 0.50–1.10)
Creatinine, Ser: 0.67 mg/dL (ref 0.50–1.10)
GFR calc Af Amer: >90 mL/min (ref >90)
GFR calc Af Amer: >90 mL/min (ref >90)
GFR calc Af Amer: >90 mL/min (ref >90)
GFR calc non Af Amer: >90 mL/min (ref >90)
GFR calc non Af Amer: >90 mL/min (ref >90)
GFR calc non Af Amer: >90 mL/min (ref >90)
Glucose, Bld: 129 mg/dL — ABNORMAL HIGH (ref 70–99)
Glucose, Bld: 140 mg/dL — ABNORMAL HIGH (ref 70–99)
Glucose, Bld: 161 mg/dL — ABNORMAL HIGH (ref 70–99)
Potassium: 3.9 mEq/L (ref 3.5–5.1)
Potassium: 3.2 mEq/L — ABNORMAL LOW (ref 3.5–5.1)
Potassium: 3.8 mEq/L (ref 3.5–5.1)
Sodium: 141 mEq/L (ref 135–145)
Sodium: 152 mEq/L — ABNORMAL HIGH (ref 135–145)
Sodium: 149 mEq/L — ABNORMAL HIGH (ref 135–145)

## 2011-09-29 LAB — POCT URINE SPECIFIC GRAVITY, REFRACTOMETER: Specific gravity, refractometer-urine: 1.005 (ref 1.001–1.035)

## 2011-09-29 SURGERY — CRANIOTOMY TUMOR EXCISION
Anesthesia: General | Wound class: Clean

## 2011-09-29 MED ORDER — DEXAMETHASONE SODIUM PHOSPHATE 4 MG/ML IJ SOLN
4.0000 mg | Freq: Two times a day (BID) | INTRAMUSCULAR | Status: DC
  Administered 2011-10-01 – 2011-10-02 (×2): 4 mg via INTRAVENOUS
  Filled 2011-09-29 (×3): qty 1

## 2011-09-29 MED ORDER — SODIUM CHLORIDE 0.9 % IR SOLN
Status: DC | PRN
  Administered 2011-09-29: 10:00:00

## 2011-09-29 MED ORDER — HEMOSTATIC AGENTS (NO CHARGE) OPTIME
TOPICAL | Status: DC | PRN
  Administered 2011-09-29: 1 via TOPICAL

## 2011-09-29 MED ORDER — MORPHINE SULFATE 2 MG/ML IJ SOLN
1.0000 mg | INTRAMUSCULAR | Status: DC | PRN
  Administered 2011-09-29 – 2011-09-30 (×4): 2 mg via INTRAVENOUS
  Administered 2011-09-30: 1 mg via INTRAVENOUS
  Filled 2011-09-29 (×6): qty 1

## 2011-09-29 MED ORDER — ONDANSETRON HCL 4 MG PO TABS: 4.0000 mg | ORAL_TABLET | ORAL | Status: DC | PRN

## 2011-09-29 MED ORDER — HYDROXYZINE HCL 50 MG PO TABS
50.0000 mg | ORAL_TABLET | ORAL | Status: DC | PRN
  Filled 2011-09-29: qty 1

## 2011-09-29 MED ORDER — WHITE PETROLATUM GEL
Status: AC
  Administered 2011-09-29: 14:00:00
  Filled 2011-09-29: qty 5

## 2011-09-29 MED ORDER — MAGNESIUM HYDROXIDE 400 MG/5ML PO SUSP: 30.0000 mL | Freq: Every day | ORAL | Status: DC | PRN

## 2011-09-29 MED ORDER — SODIUM CHLORIDE 0.45 % IV SOLN
INTRAVENOUS | Status: DC
  Administered 2011-09-29: 16:00:00 via INTRAVENOUS

## 2011-09-29 MED ORDER — PROPOFOL 10 MG/ML IV EMUL
INTRAVENOUS | Status: DC | PRN
  Administered 2011-09-29: 200 mg via INTRAVENOUS
  Administered 2011-09-29: 50 mg via INTRAVENOUS
  Administered 2011-09-29: 100 mg via INTRAVENOUS
  Administered 2011-09-29: 50 mg via INTRAVENOUS

## 2011-09-29 MED ORDER — HYDROMORPHONE HCL PF 1 MG/ML IJ SOLN: 0.2500 mg | INTRAMUSCULAR | Status: DC | PRN

## 2011-09-29 MED ORDER — FENTANYL CITRATE 0.05 MG/ML IJ SOLN
INTRAMUSCULAR | Status: DC | PRN
  Administered 2011-09-29: 50 ug via INTRAVENOUS
  Administered 2011-09-29 (×3): 25 ug via INTRAVENOUS
  Administered 2011-09-29 (×5): 50 ug via INTRAVENOUS
  Administered 2011-09-29: 25 ug via INTRAVENOUS
  Administered 2011-09-29: 50 ug via INTRAVENOUS
  Administered 2011-09-29: 100 ug via INTRAVENOUS
  Administered 2011-09-29: 50 ug via INTRAVENOUS

## 2011-09-29 MED ORDER — PANTOPRAZOLE SODIUM 40 MG IV SOLR
40.0000 mg | Freq: Every day | INTRAVENOUS | Status: DC
  Administered 2011-09-29 – 2011-09-30 (×2): 40 mg via INTRAVENOUS
  Filled 2011-09-29 (×3): qty 40

## 2011-09-29 MED ORDER — BISACODYL 10 MG RE SUPP
10.0000 mg | Freq: Every day | RECTAL | Status: DC | PRN
  Administered 2011-10-04: 10 mg via RECTAL
  Filled 2011-09-29: qty 1

## 2011-09-29 MED ORDER — ONDANSETRON HCL 4 MG/2ML IJ SOLN
4.0000 mg | INTRAMUSCULAR | Status: DC | PRN
  Filled 2011-09-29: qty 2

## 2011-09-29 MED ORDER — MICROFIBRILLAR COLL HEMOSTAT EX PADS
MEDICATED_PAD | CUTANEOUS | Status: DC | PRN
  Administered 2011-09-29: 1 via TOPICAL

## 2011-09-29 MED ORDER — ROCURONIUM BROMIDE 100 MG/10ML IV SOLN
INTRAVENOUS | Status: DC | PRN
  Administered 2011-09-29: 50 mg via INTRAVENOUS

## 2011-09-29 MED ORDER — THROMBIN 20000 UNITS EX KIT
PACK | CUTANEOUS | Status: DC | PRN
  Administered 2011-09-29: 20000 [IU] via TOPICAL

## 2011-09-29 MED ORDER — LIDOCAINE HCL 4 % MT SOLN
OROMUCOSAL | Status: DC | PRN
  Administered 2011-09-29: 4 mL via TOPICAL

## 2011-09-29 MED ORDER — PHENYLEPHRINE HCL 10 MG/ML IJ SOLN
10.0000 mg | INTRAVENOUS | Status: DC | PRN
  Administered 2011-09-29: 10 ug/min via INTRAVENOUS

## 2011-09-29 MED ORDER — MANNITOL 20 % IV SOLN
INTRAVENOUS | Status: DC | PRN
  Administered 2011-09-29: 11:00:00 via INTRAVENOUS

## 2011-09-29 MED ORDER — DROSPIRENONE-ETHINYL ESTRADIOL 3-0.02 MG PO TABS
1.0000 | ORAL_TABLET | Freq: Every day | ORAL | Status: DC
  Administered 2011-09-29 – 2011-10-05 (×7): 1 via ORAL

## 2011-09-29 MED ORDER — GLYCOPYRROLATE 0.2 MG/ML IJ SOLN
INTRAMUSCULAR | Status: DC | PRN
  Administered 2011-09-29: .6 mg via INTRAVENOUS
  Administered 2011-09-29: 0.1 mg via INTRAVENOUS

## 2011-09-29 MED ORDER — VECURONIUM BROMIDE 10 MG IV SOLR
INTRAVENOUS | Status: DC | PRN
  Administered 2011-09-29: 2 mg via INTRAVENOUS
  Administered 2011-09-29: 1 mg via INTRAVENOUS
  Administered 2011-09-29: 3 mg via INTRAVENOUS

## 2011-09-29 MED ORDER — DEXAMETHASONE SODIUM PHOSPHATE 10 MG/ML IJ SOLN
4.0000 mg | Freq: Four times a day (QID) | INTRAMUSCULAR | Status: AC
  Administered 2011-09-29 – 2011-09-30 (×4): 4 mg via INTRAVENOUS
  Filled 2011-09-29 (×4): qty 0.4

## 2011-09-29 MED ORDER — DESMOPRESSIN ACETATE 4 MCG/ML IJ SOLN
1.0000 ug | Freq: Once | INTRAMUSCULAR | Status: AC
  Administered 2011-09-29: 1 ug via INTRAVENOUS
  Filled 2011-09-29: qty 0.25

## 2011-09-29 MED ORDER — DEXAMETHASONE SODIUM PHOSPHATE 10 MG/ML IJ SOLN
INTRAMUSCULAR | Status: DC | PRN
  Administered 2011-09-29: 10 mg via INTRAVENOUS

## 2011-09-29 MED ORDER — SODIUM CHLORIDE 0.9 % IV SOLN: INTRAVENOUS | Status: DC

## 2011-09-29 MED ORDER — ARTIFICIAL TEARS OP OINT
TOPICAL_OINTMENT | OPHTHALMIC | Status: DC | PRN
  Administered 2011-09-29: 1 via OPHTHALMIC

## 2011-09-29 MED ORDER — SODIUM CHLORIDE 0.9 % IR SOLN
Status: DC | PRN
  Administered 2011-09-29 (×2): 1000 mL

## 2011-09-29 MED ORDER — SODIUM CHLORIDE 0.9 % IV SOLN
INTRAVENOUS | Status: DC | PRN
  Administered 2011-09-29 (×2): via INTRAVENOUS

## 2011-09-29 MED ORDER — MIDAZOLAM HCL 5 MG/5ML IJ SOLN
INTRAMUSCULAR | Status: DC | PRN
  Administered 2011-09-29: 2 mg via INTRAVENOUS

## 2011-09-29 MED ORDER — LABETALOL HCL 5 MG/ML IV SOLN: 5.0000 mg | INTRAVENOUS | Status: DC | PRN

## 2011-09-29 MED ORDER — METHYLENE BLUE 1 % INJ SOLN
INTRAMUSCULAR | Status: DC | PRN
  Administered 2011-09-29: 10 mL

## 2011-09-29 MED ORDER — BUPIVACAINE HCL (PF) 0.25 % IJ SOLN
INTRAMUSCULAR | Status: DC | PRN
  Administered 2011-09-29: 25 mL

## 2011-09-29 MED ORDER — POTASSIUM CHLORIDE IN NACL 20-0.45 MEQ/L-% IV SOLN
INTRAVENOUS | Status: DC
  Administered 2011-09-29 – 2011-10-02 (×6): via INTRAVENOUS
  Filled 2011-09-29 (×12): qty 1000

## 2011-09-29 MED ORDER — ONDANSETRON HCL 4 MG/2ML IJ SOLN: 4.0000 mg | Freq: Once | INTRAMUSCULAR | Status: DC | PRN

## 2011-09-29 MED ORDER — BACITRACIN 50000 UNITS IM SOLR
INTRAMUSCULAR | Status: AC
  Filled 2011-09-29: qty 1

## 2011-09-29 MED ORDER — NEOSTIGMINE METHYLSULFATE 1 MG/ML IJ SOLN
INTRAMUSCULAR | Status: DC | PRN
  Administered 2011-09-29: 4 mg via INTRAVENOUS

## 2011-09-29 MED ORDER — SODIUM CHLORIDE 0.9 % IV SOLN
INTRAVENOUS | Status: DC | PRN
  Administered 2011-09-29 (×2): via INTRAVENOUS

## 2011-09-29 MED ORDER — GLYCOPYRROLATE 0.2 MG/ML IJ SOLN: INTRAMUSCULAR | Status: DC | PRN

## 2011-09-29 MED ORDER — ONDANSETRON HCL 4 MG/2ML IJ SOLN
INTRAMUSCULAR | Status: DC | PRN
  Administered 2011-09-29: 4 mg via INTRAVENOUS

## 2011-09-29 MED ORDER — SODIUM CHLORIDE 0.9 % IV SOLN
INTRAVENOUS | Status: AC
  Filled 2011-09-29: qty 500

## 2011-09-29 MED ORDER — LIDOCAINE-EPINEPHRINE 1 %-1:100000 IJ SOLN
INTRAMUSCULAR | Status: DC | PRN
  Administered 2011-09-29: 25 mL via INTRADERMAL

## 2011-09-29 MED ORDER — DEXTROSE 5 % IV SOLN
1.0000 g | INTRAVENOUS | Status: DC
  Filled 2011-09-29: qty 10

## 2011-09-29 MED ORDER — DEXAMETHASONE SODIUM PHOSPHATE 4 MG/ML IJ SOLN
4.0000 mg | Freq: Three times a day (TID) | INTRAMUSCULAR | Status: AC
  Administered 2011-09-30 – 2011-10-01 (×4): 4 mg via INTRAVENOUS
  Filled 2011-09-29 (×4): qty 1

## 2011-09-29 MED ORDER — DEXTROSE 5 % IV SOLN
1.0000 g | INTRAVENOUS | Status: DC | PRN
  Administered 2011-09-29: 1 g via INTRAVENOUS

## 2011-09-29 MED ORDER — LIDOCAINE HCL (CARDIAC) 20 MG/ML IV SOLN
INTRAVENOUS | Status: DC | PRN
  Administered 2011-09-29: 50 mg via INTRAVENOUS

## 2011-09-29 MED ORDER — HYDROXYZINE HCL 50 MG/ML IM SOLN
50.0000 mg | INTRAMUSCULAR | Status: DC | PRN
  Administered 2011-09-29 – 2011-09-30 (×2): 50 mg via INTRAMUSCULAR
  Filled 2011-09-29 (×2): qty 1

## 2011-09-29 MED ORDER — FAMOTIDINE IN NACL 20-0.9 MG/50ML-% IV SOLN
20.0000 mg | INTRAVENOUS | Status: DC
  Filled 2011-09-29: qty 50

## 2011-09-29 SURGICAL SUPPLY — 85 items
APPLICATOR COTTON TIP 6IN STRL (MISCELLANEOUS) ×2 IMPLANT
BAG DECANTER FOR FLEXI CONT (MISCELLANEOUS) ×2 IMPLANT
BALL CTTN LRG ABS STRL LF (GAUZE/BANDAGES/DRESSINGS)
BANDAGE GAUZE 4  KLING STR (GAUZE/BANDAGES/DRESSINGS) ×3 IMPLANT
BANDAGE GAUZE ELAST BULKY 4 IN (GAUZE/BANDAGES/DRESSINGS) ×1 IMPLANT
BIT DRILL WIRE PASS 1.3MM (BIT) IMPLANT
BLADE EYE SICKLE 84 5 BEAV (BLADE) ×1 IMPLANT
BLADE SURG 11 STRL SS (BLADE) ×1 IMPLANT
BLADE SURG ROTATE 9660 (MISCELLANEOUS) ×4 IMPLANT
BLADE ULTRA TIP 2M (BLADE) ×1 IMPLANT
BRUSH SCRUB EZ PLAIN DRY (MISCELLANEOUS) ×2 IMPLANT
BUR ACORN 6.0 PRECISION (BURR) ×2 IMPLANT
BUR MATCHSTICK NEURO 3.0 LAGG (BURR) ×1 IMPLANT
BUR ROUTER D-58 CRANI (BURR) ×1 IMPLANT
CANISTER SUCTION 2500CC (MISCELLANEOUS) ×2 IMPLANT
CLIP RANEY DISP (INSTRUMENTS) ×2 IMPLANT
CLIP TI MEDIUM 6 (CLIP) IMPLANT
CLOTH BEACON ORANGE TIMEOUT ST (SAFETY) ×2 IMPLANT
CONT SPEC 4OZ CLIKSEAL STRL BL (MISCELLANEOUS) ×7 IMPLANT
CORDS BIPOLAR (ELECTRODE) ×2 IMPLANT
COTTONBALL LRG STERILE PKG (GAUZE/BANDAGES/DRESSINGS) IMPLANT
DRAIN SNY WOU 7FLT (WOUND CARE) IMPLANT
DRAIN SUBARACHNOID (WOUND CARE) IMPLANT
DRAPE MICROSCOPE LEICA (MISCELLANEOUS) ×1 IMPLANT
DRAPE NEUROLOGICAL W/INCISE (DRAPES) ×2 IMPLANT
DRAPE STERI IOBAN 125X83 (DRAPES) IMPLANT
DRAPE SURG 17X23 STRL (DRAPES) IMPLANT
DRAPE WARM FLUID 44X44 (DRAPE) ×2 IMPLANT
DRESSING TELFA 8X3 (GAUZE/BANDAGES/DRESSINGS) IMPLANT
DRILL WIRE PASS 1.3MM (BIT)
DRSG ADAPTIC 3X8 NADH LF (GAUZE/BANDAGES/DRESSINGS) ×2 IMPLANT
ELECT CAUTERY BLADE 6.4 (BLADE) ×2 IMPLANT
ELECT REM PT RETURN 9FT ADLT (ELECTROSURGICAL) ×2
ELECTRODE REM PT RTRN 9FT ADLT (ELECTROSURGICAL) ×1 IMPLANT
EVACUATOR 1/8 PVC DRAIN (DRAIN) IMPLANT
EVACUATOR SILICONE 100CC (DRAIN) IMPLANT
GLOVE BIOGEL PI IND STRL 7.0 (GLOVE) IMPLANT
GLOVE BIOGEL PI IND STRL 8 (GLOVE) ×1 IMPLANT
GLOVE BIOGEL PI INDICATOR 7.0 (GLOVE) ×1
GLOVE BIOGEL PI INDICATOR 8 (GLOVE) ×1
GLOVE ECLIPSE 6.5 STRL STRAW (GLOVE) ×1 IMPLANT
GLOVE ECLIPSE 7.5 STRL STRAW (GLOVE) ×2 IMPLANT
GLOVE EXAM NITRILE LRG STRL (GLOVE) IMPLANT
GLOVE EXAM NITRILE MD LF STRL (GLOVE) ×1 IMPLANT
GLOVE EXAM NITRILE XL STR (GLOVE) IMPLANT
GLOVE EXAM NITRILE XS STR PU (GLOVE) IMPLANT
GLOVE SURG SS PI 6.5 STRL IVOR (GLOVE) ×2 IMPLANT
GOWN BRE IMP SLV AUR LG STRL (GOWN DISPOSABLE) ×1 IMPLANT
GOWN BRE IMP SLV AUR XL STRL (GOWN DISPOSABLE) ×2 IMPLANT
GOWN STRL REIN 2XL LVL4 (GOWN DISPOSABLE) IMPLANT
HEMOSTAT SURGICEL 2X14 (HEMOSTASIS) ×3 IMPLANT
HOOK DURA (MISCELLANEOUS) ×3 IMPLANT
KIT BASIN OR (CUSTOM PROCEDURE TRAY) ×2 IMPLANT
KIT ROOM TURNOVER OR (KITS) ×2 IMPLANT
MARKER SPHERE PSV REFLC NDI (MISCELLANEOUS) IMPLANT
NDL SPNL 22GX3.5 QUINCKE BK (NEEDLE) ×1 IMPLANT
NEEDLE SPNL 22GX3.5 QUINCKE BK (NEEDLE) ×2 IMPLANT
NS IRRIG 1000ML POUR BTL (IV SOLUTION) ×4 IMPLANT
PACK CRANIOTOMY (CUSTOM PROCEDURE TRAY) ×2 IMPLANT
PAD ARMBOARD 7.5X6 YLW CONV (MISCELLANEOUS) ×2 IMPLANT
PAD EYE OVAL STERILE LF (GAUZE/BANDAGES/DRESSINGS) IMPLANT
PATTIES SURGICAL .25X.25 (GAUZE/BANDAGES/DRESSINGS) ×1 IMPLANT
PATTIES SURGICAL .5 X1 (DISPOSABLE) ×1 IMPLANT
PATTIES SURGICAL 1/4 X 3 (GAUZE/BANDAGES/DRESSINGS) ×1 IMPLANT
PATTIES SURGICAL 3 X3 (GAUZE/BANDAGES/DRESSINGS)
PATTIES SURGICAL 3X3 (GAUZE/BANDAGES/DRESSINGS) IMPLANT
PLATE 1.5  2HOLE LNG NEURO (Plate) ×1 IMPLANT
PLATE 1.5 2HOLE LNG NEURO (Plate) IMPLANT
PLATE 1.5 5HOLE SQUARE (Plate) ×2 IMPLANT
RUBBERBAND STERILE (MISCELLANEOUS) ×2 IMPLANT
SCREW SELF DRILL HT 1.5/4MM (Screw) ×10 IMPLANT
SPONGE GAUZE 4X4 12PLY (GAUZE/BANDAGES/DRESSINGS) ×2 IMPLANT
SPONGE SURGIFOAM ABS GEL 100 (HEMOSTASIS) ×2 IMPLANT
STAPLER SKIN PROX WIDE 3.9 (STAPLE) ×3 IMPLANT
SUT NURALON 4 0 TR CR/8 (SUTURE) ×7 IMPLANT
SUT VIC AB 0 CT1 18XCR BRD8 (SUTURE) ×2 IMPLANT
SUT VIC AB 0 CT1 8-18 (SUTURE) ×4
SUT VIC AB 2-0 CP2 18 (SUTURE) ×4 IMPLANT
SYR 20ML ECCENTRIC (SYRINGE) ×2 IMPLANT
SYR CONTROL 10ML LL (SYRINGE) ×2 IMPLANT
TOWEL OR 17X24 6PK STRL BLUE (TOWEL DISPOSABLE) ×2 IMPLANT
TOWEL OR 17X26 10 PK STRL BLUE (TOWEL DISPOSABLE) ×2 IMPLANT
TRAY FOLEY CATH 16FRSI W/METER (SET/KITS/TRAYS/PACK) ×1 IMPLANT
UNDERPAD 30X30 INCONTINENT (UNDERPADS AND DIAPERS) ×1 IMPLANT
WATER STERILE IRR 1000ML POUR (IV SOLUTION) ×3 IMPLANT

## 2011-09-29 NOTE — Progress Notes (Signed)
Contacted Dr. Newell Coral regarding patient's urine output of 275 mL for the first thirty minutes after patient's arrival and 500 mL for the last hour.  SG was 1.005 at 13:45.  Na+ was 141 at 12:55.  Will continue to monitor patient and update as needed.  [facility] 09/29/2011 3:16 PM

## 2011-09-29 NOTE — Progress Notes (Signed)
I spoke with Marisa Galloway in blood bank and T&S is resulted and still good. It was drawn on 09-23-11.

## 2011-09-29 NOTE — Anesthesia Procedure Notes (Signed)
Procedure Name: Intubation Date/Time: 09/29/2011 9:09 AM Performed by: Luster Landsberg Pre-anesthesia Checklist: Patient identified, Emergency Drugs available, Suction available and Patient being monitored Patient Re-evaluated:Patient Re-evaluated prior to inductionOxygen Delivery Method: Circle system utilized Preoxygenation: Pre-oxygenation with 100% oxygen Intubation Type: IV induction Ventilation: Mask ventilation without difficulty Laryngoscope Size: Mac and 3 Grade View: Grade I Tube type: Oral Tube size: 7.0 mm Number of attempts: 1 Airway Equipment and Method: LTA kit utilized and Stylet Placement Confirmation: ETT inserted through vocal cords under direct vision,  positive ETCO2 and breath sounds checked- equal and bilateral Secured at: 22 cm Tube secured with: Tape Dental Injury: Teeth and Oropharynx as per pre-operative assessment

## 2011-09-29 NOTE — Op Note (Signed)
09/29/2011  12:44 PM  PATIENT:  Marisa Galloway  31 y.o. female  PRE-OPERATIVE DIAGNOSIS:  pituitary tumor  POST-OPERATIVE DIAGNOSIS:  pituitary tumor  PROCEDURE:  Procedure(s): CRANIOTOMY TUMOR EXCISION: Right pterional craniotomy and gross total resection of pituitary tumor with microdissection, microsurgical technique, and the operating microscope  SURGEON:  Surgeon(s): Hewitt Shorts, MD Carmela Hurt, MD  ASSISTANTS: Coletta Memos, M.D.  ANESTHESIA:   general  EBL:  Total I/O In: 3150 [I.V.:3000; IV Piggyback:150] Out: 1485 [Urine:1385; Blood:100]  BLOOD ADMINISTERED:none  COUNT: Correct per nursing staff  SPECIMEN:  Tumor  DICTATION: Patient was brought to the operating room, placed under general endotracheal anesthesia. The 3 pin Mayfield head holder was applied, a roll was placed in the right shoulder and the head, neck, and torso were gently turned to the left. The right pterional region was shaved, and then prepped with Betadine soap and solution and draped in a sterile fashion. A curvilinear right pterional incision was made, the line of incision was infiltrated with local anesthetic with epinephrine, and Raney clips were applied to the scalp edges to maintain hemostasis. The scalp flap was reflected anterior inferiorly. We then open the temporalis fascia, leaving a cuff of fascia along the superior temporal line, another fascial incision was made in a vertical fashion approximately a third of the way posteriorly along the temporal muscle, and the temporal muscle was elevated off the skull immobilized posterior inferiorly. Craniotomy was created with a bur hole at the keyhole location and a second burr hole in the posterior temporal region. The dura was dissected from the overlying skull, and then used the craniotome attachment bone flap was turned. We directly drilled down on the pterion, and then were able to elevate the bone flap. With loupes magnification we carefully  removed the lesser wing of the sphenoid, so as to increase our ventral intradural exposure. The dura was tacked up around the margins of the craniotomy with 4-0 Nurolon sutures. The buddy halo self-retaining retractor system was set up. The dura was opened in a curvilinear fashion hinged towards the pterion. The patient was given 0.5 g per kilogram of 20% Osmitrol (150cc) so as to decrease cerebral volume, and we also aspirated CSF. A good diuresis occurred and good relaxation of the brain developed. The operating microscope was then draped and brought in the field provided additional magnification, illumination, and visualization. We began to open the right sylvian fissure. The arachnoid was incised and gently opened. Gentle traction was placed on the right frontal lobe. We were able to progressively opened the sylvian fissure down to the right internal carotid artery. We identified the right optic nerve, the right olfactory nerve, and the right oculomotor nerve. We then identified the tumor, best visualized in the carotid optic fissure. The arachnoid over that fissure was opened and the tumor capsule identified. We also exposed the tumor capsule in the pre-chiasmal space, between the left and right optic nerves. We incised the tumor capsule through the carotid optic fissure, and creamy white necrotic tumor fluid was aspirated, and the tumor capsule collapsed. We then began to mobilize the tumor capsule from the surrounding arachnoid and dural membranes. We began to remove the tumor capsule in a piecemeal fashion, saving the tumor tissue for pathologic examination. Working both through [employer] space and the carotid optic fissure we able to fully mobilize the tumor, and in the end achieved a gross total resection. We're to visualize the floor of the sella and examine the  anterior and posterior aspect the sella, no additional tumor was seen. Good decompression of the optic nerves, chiasm, and tracts was  achieved. We then used Gelfoam with thrombin to establish hemostasis. The Gelfoam was removed and hemostasis confirmed. We then proceeded with closure. The right frontal self-retaining retractor was removed. The Cottonoid patties were removed. The dura was closed with interrupted 4-0 Nurolon sutures. The intradural space was filled with saline prior final closure the dura. Lorenz cranial plates were placed on the bone flap, and 2 dural tack up sutures were placed, tack the dura up to the bone flap. The bone flap was secured to the skull using the Lorenz cranial plates and screws. The temporalis fascia was closed with interrupted undyed 0 Vicryl sutures. The galea was closed with interrupted inverted 0 and 2-0 undyed Vicryl sutures. Skin is closed with surgical staples. The wounds dressed with Adaptic and sterile gauze. The head was wrapped with Kling and Kerlix. Following surgery the patient was taken out of the 3 pin Mayfield head holder, reversed and the anesthetic, extubated, and transferred to the recovery further care where she was noted moving all 4 extremities to command.  PLAN OF CARE: Admit to inpatient   PATIENT DISPOSITION:  PACU - hemodynamically stable.   Delay start of Pharmacological VTE agent (>24hrs) due to surgical blood loss or risk of bleeding:  yes

## 2011-09-29 NOTE — Progress Notes (Signed)
Subjective:  Patient resting in bed comfortably. She had some nausea earlier, was given Vistaril, and the nausea has resolved. No significant headache at this time. Urine output has been over 2 L over the past 3-4 hours or so. The BMET rechecked and shows significant rise in sodium.  Objective: Vital signs in last 24 hours: Filed Vitals:   09/29/11 1500 09/29/11 1529 09/29/11 1600 09/29/11 1700  BP: 95/63  106/62 104/68  Pulse: 74  114 82  Temp:  97.8 F (36.6 C)    TempSrc:  Oral    Resp: 20  18 20   Height:      Weight:      SpO2: 99%  100% 99%    Intake/Output from previous day:   Intake/Output this shift: Total I/O In: 3420 [I.V.:3270; IV Piggyback:150] Out: 4460 [Urine:4360; Blood:100]  Physical Exam:  Awake and alert, oriented. Vision is good. Able to read the time on the clock on the wall, and read all the signs on the wall. Moving all 4 extremities well. Dressing clean and dry.  BMET  Basename 09/29/11 1628 09/29/11 1255  NA 152* 141  K 3.2* 3.9  CL 124* 109  CO2 17* 20  GLUCOSE 140* 129*  BUN 5* 7  CREATININE 0.50 0.60  CALCIUM 7.2* 7.8*     Assessment/Plan: Patient is neurologically stable. 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 With nausea resolved I will allow the patient to have ice chips.   Hewitt Shorts, MD 09/29/2011, 6:00 PM

## 2011-09-29 NOTE — H&P (Signed)
Subjective: Patient is a 31 y.o. female who is admitted for treatment of recurrent pituitary adenoma, with compression of the optic chiasm, nerves, and tracts.patient presented over 4-1/2 months ago with bitemporal hemianopsia. She initially presented with headache which had resolved. MRI scan showed macroadenoma with suspected subacute hemorrhage. She underwent transsphenoidal resection of tumor and initially had improvement in her visual fields. However she suddenly felt a visual field worsened some and repeat visual fields from April showed persistence of bitemporal hemianopsia. Repeat MRI last month showed recurrence of the pituitary cystic mass. Visual field repeated a couple weeks ago and showed significant improvement in her visual fields, however despite that, with the recurrent pituitary mass it is still felt that re-resection is needed, and the patient is now admitted for craniotomy for resection of pituitary tumor.    Past Medical History  Diagnosis Date  . Vision changes     bilateral hemianopsia  . Pituitary tumor   . Seasonal allergies     doesn't require meds  . Headache     occasionally    Past Surgical History  Procedure Date  . Salivary gland surgery 08/09/2007  . Craniotomy 05/18/2011    Procedure: CRANIOTOMY HYPOPHYSECTOMY TRANSNASAL APPROACH;  Surgeon: Hewitt Shorts, MD;  Location: [facility];  Service: Neurosurgery;  Laterality: N/A;  Transphenoidal resection of pituitary tumor with Dr Annalee Genta Fat graft from abdomen  . Nasal sinus surgery 05/18/2011    Procedure: ENDOSCOPIC SINUS SURGERY WITH STEALTH;  Surgeon: Barbee Cough, MD;  Location: [facility];  Service: ENT;  Laterality: N/A;  Opening for Transphenoidal for pituitary tumor    Prescriptions prior to admission  Medication Sig Dispense Refill  . drospirenone-ethinyl estradiol (YAZ,GIANVI,LORYNA) 3-0.02 MG tablet Take 1 tablet by mouth daily.      . naproxen sodium (ANAPROX) 220 MG tablet Take 220 mg by  mouth as needed. For pain.       No Known Allergies  History  Substance Use Topics  . Smoking status: Never Smoker   . Smokeless tobacco: Not on file  . Alcohol Use: Yes     rare glass of wine    No family history on file.   Review of Systems A comprehensive review of systems was negative.  Objective: Vital signs in last 24 hours: Temp:  [98 F (36.7 C)] 98 F (36.7 C) (06/26 0718) Pulse Rate:  [86] 86  (06/26 0718) Resp:  [20] 20  (06/26 0718) BP: (99)/(65) 99/65 mmHg (06/26 0718) SpO2:  [99 %] 99 % (06/26 0718)  EXAM: Patient is a well-developed well-nourished black female in no acute distress.  Lungs are clear to auscultation , the patient has symmetrical respiratory excursion. Heart has a regular rate and rhythm normal S1 and S2 no murmur.   Abdomen is soft nontender nondistended bowel sounds are present. Extremity examination shows no clubbing cyanosis or edema. Patient is awake and alert, fully oriented. Speech is fluent. She has good comprehension. Cranial nerves show pupils are equal round and reactive to light. Extraocular movements are intact. Facial movement is symmetrical. Hearing is present bilaterally. Palatal movement is symmetrical. Shoulder shrug is symmetrical. Tongue is midline. Motor examination shows 5 over 5 strength in the upper and lower extremities. She has no drift of the upper extremities. Sensation is intact to pinprick in the distal upper and lower extremities. Reflexes are 1-2 in the upper extremities, and trace in the lower extremities. Toes are downgoing. She has a normal gait and stance.  Data Review:CBC    Component Value Date/Time   WBC 8.0 09/23/2011 1019   RBC 4.61 09/23/2011 1019   HGB 13.7 09/23/2011 1019   HCT 40.7 09/23/2011 1019   PLT 308 09/23/2011 1019   MCV 88.3 09/23/2011 1019   MCH 29.7 09/23/2011 1019   MCHC 33.7 09/23/2011 1019   RDW 13.8 09/23/2011 1019   LYMPHSABS 1.5 05/19/2011 0358   MONOABS 0.7 05/19/2011 0358   EOSABS 0.0  05/19/2011 0358   BASOSABS 0.0 05/19/2011 0358                          BMET    Component Value Date/Time   NA 137 05/24/2011 0442   K 4.2 05/24/2011 0442   CL 100 05/24/2011 0442   CO2 29 05/24/2011 0442   GLUCOSE 74 05/24/2011 0442   BUN 10 05/24/2011 0442   CREATININE 0.65 05/24/2011 0442   CALCIUM 9.4 05/24/2011 0442   GFRNONAA >90 05/24/2011 0442   GFRAA >90 05/24/2011 0442     Assessment/Plan: Patient admitted for craniotomy for resection of pituitary tumor. I spoke with the patient and her mother at length, I've reviewed her studies with Dr. Franky Macho, I've reviewed the nature the procedure, typical of surgery, ICU stay, and hospital stay.we discussed risks of surgery, including risks of infection, bleeding, possibly for transfusion, the risk of neurologic dysfunction including paralysis, seizures, loss of vision, altered pituitary function, and coma, and discussed anesthetic risks of myocardial infarction, stroke, pneumonia, and death. We also discussed the possibility of recurrence once again of this cystic pituitary tumor. Understanding all this patient does wish to proceed with surgery and is admitted for such.    Hewitt Shorts, MD 09/29/2011 8:36 AM

## 2011-09-29 NOTE — Transfer of Care (Signed)
Immediate Anesthesia Transfer of Care Note  Patient: Marisa Galloway  Procedure(s) Performed: Procedure(s) (LRB): CRANIOTOMY TUMOR EXCISION (N/A)  Patient Location: PACU  Anesthesia Type: General  Level of Consciousness: awake  Airway & Oxygen Therapy: Patient Spontanous Breathing and Patient connected to face mask oxygen  Post-op Assessment: Report given to PACU RN, Post -op Vital signs reviewed and stable and Patient moving all extremities  Post vital signs: Reviewed and stable  Complications: No apparent anesthesia complications

## 2011-09-29 NOTE — Anesthesia Preprocedure Evaluation (Addendum)
Anesthesia Evaluation  Patient identified by MRN, date of birth, ID band Patient awake    Reviewed: Allergy & Precautions, H&P , NPO status , Patient's Chart, lab work & pertinent test results  Airway Mallampati: I TM Distance: >3 FB Neck ROM: Full    Dental  (+) Teeth Intact and Dental Advisory Given   Pulmonary  breath sounds clear to auscultation        Cardiovascular Rhythm:Regular Rate:Normal     Neuro/Psych    GI/Hepatic   Endo/Other    Renal/GU      Musculoskeletal   Abdominal   Peds  Hematology   Anesthesia Other Findings   Reproductive/Obstetrics                           Anesthesia Physical Anesthesia Plan  ASA: II  Anesthesia Plan: General   Post-op Pain Management:    Induction: Intravenous  Airway Management Planned: Oral ETT  Additional Equipment: Arterial line  Intra-op Plan:   Post-operative Plan:   Informed Consent: I have reviewed the patients History and Physical, chart, labs and discussed the procedure including the risks, benefits and alternatives for the proposed anesthesia with the patient or authorized representative who has indicated his/her understanding and acceptance.   Dental advisory given  Plan Discussed with: CRNA, Anesthesiologist and Surgeon  Anesthesia Plan Comments:        Anesthesia Quick Evaluation

## 2011-09-29 NOTE — Anesthesia Postprocedure Evaluation (Signed)
  Anesthesia Post-op Note  Patient: Marisa Galloway  Procedure(s) Performed: Procedure(s) (LRB): CRANIOTOMY TUMOR EXCISION (N/A)  Patient Location: Neuro ICU  Anesthesia Type: General  Level of Consciousness: awake, alert  and oriented  Airway and Oxygen Therapy: Patient Spontanous Breathing  Post-op Pain: mild  Post-op Assessment: Post-op Vital signs reviewed  Post-op Vital Signs: Reviewed  Complications: No apparent anesthesia complications

## 2011-09-30 ENCOUNTER — Encounter (HOSPITAL_COMMUNITY): Payer: Self-pay | Admitting: Neurosurgery

## 2011-09-30 LAB — BASIC METABOLIC PANEL
BUN: 5 mg/dL — ABNORMAL LOW (ref 6–23)
BUN: 5 mg/dL — ABNORMAL LOW (ref 6–23)
CO2: 20 mEq/L (ref 19–32)
CO2: 22 mEq/L (ref 19–32)
Calcium: 8.5 mg/dL (ref 8.4–10.5)
Calcium: 8.6 mg/dL (ref 8.4–10.5)
Chloride: 112 mEq/L (ref 96–112)
Chloride: 108 mEq/L (ref 96–112)
Creatinine, Ser: 0.64 mg/dL (ref 0.50–1.10)
Creatinine, Ser: 0.58 mg/dL (ref 0.50–1.10)
GFR calc Af Amer: >90 mL/min (ref >90)
GFR calc Af Amer: >90 mL/min (ref >90)
GFR calc non Af Amer: >90 mL/min (ref >90)
GFR calc non Af Amer: >90 mL/min (ref >90)
Glucose, Bld: 187 mg/dL — ABNORMAL HIGH (ref 70–99)
Glucose, Bld: 118 mg/dL — ABNORMAL HIGH (ref 70–99)
Potassium: 3.6 mEq/L (ref 3.5–5.1)
Potassium: 3.4 mEq/L — ABNORMAL LOW (ref 3.5–5.1)
Sodium: 146 mEq/L — ABNORMAL HIGH (ref 135–145)
Sodium: 142 mEq/L (ref 135–145)

## 2011-09-30 LAB — CBC
HCT: 34.7 % — ABNORMAL LOW (ref 36.0–46.0)
Hemoglobin: 11.5 g/dL — ABNORMAL LOW (ref 12.0–15.0)
MCH: 29.3 pg (ref 26.0–34.0)
MCHC: 33.1 g/dL (ref 30.0–36.0)
MCV: 88.3 fL (ref 78.0–100.0)
Platelets: 288 10*3/uL (ref 150–400)
RBC: 3.93 MIL/uL (ref 3.87–5.11)
RDW: 14.2 % (ref 11.5–15.5)
WBC: 17.7 10*3/uL — ABNORMAL HIGH (ref 4.0–10.5)

## 2011-09-30 LAB — DIFFERENTIAL
Basophils Absolute: 0 10*3/uL (ref 0.0–0.1)
Basophils Relative: 0 % (ref 0–1)
Eosinophils Absolute: 0 10*3/uL (ref 0.0–0.7)
Eosinophils Relative: 0 % (ref 0–5)
Lymphocytes Relative: 8 % — ABNORMAL LOW (ref 12–46)
Lymphs Abs: 1.4 10*3/uL (ref 0.7–4.0)
Monocytes Absolute: 1.2 10*3/uL — ABNORMAL HIGH (ref 0.1–1.0)
Monocytes Relative: 7 % (ref 3–12)
Neutro Abs: 15 10*3/uL — ABNORMAL HIGH (ref 1.7–7.7)
Neutrophils Relative %: 85 % — ABNORMAL HIGH (ref 43–77)

## 2011-09-30 LAB — POCT URINE SPECIFIC GRAVITY, REFRACTOMETER
Specific gravity, refractometer-urine: 1.001 (ref 1.001–1.035)
Specific gravity, refractometer-urine: 1.009 (ref 1.001–1.035)
Specific gravity, refractometer-urine: 1.02 (ref 1.001–1.035)
Specific gravity, refractometer-urine: 1.017 (ref 1.001–1.035)

## 2011-09-30 MED ORDER — DESMOPRESSIN ACETATE 4 MCG/ML IJ SOLN
1.0000 ug | INTRAMUSCULAR | Status: AC
  Administered 2011-09-30: 1 ug via INTRAVENOUS
  Filled 2011-09-30: qty 0.25

## 2011-09-30 MED ORDER — OXYCODONE-ACETAMINOPHEN 5-325 MG PO TABS: 1.0000 | ORAL_TABLET | ORAL | Status: DC | PRN

## 2011-09-30 MED ORDER — HYDROCODONE-ACETAMINOPHEN 5-325 MG PO TABS
1.0000 | ORAL_TABLET | ORAL | Status: DC | PRN
Start: 2011-09-30 — End: 2011-10-06
  Administered 2011-09-30 – 2011-10-03 (×6): 1 via ORAL
  Administered 2011-10-04: 2 via ORAL
  Administered 2011-10-04 – 2011-10-05 (×2): 1 via ORAL
  Administered 2011-10-05: 2 via ORAL
  Administered 2011-10-06: 1 via ORAL
  Filled 2011-09-30: qty 2
  Filled 2011-09-30 (×2): qty 1
  Filled 2011-09-30: qty 2
  Filled 2011-09-30 (×9): qty 1

## 2011-09-30 NOTE — Progress Notes (Signed)
UR COMPLETED  

## 2011-09-30 NOTE — Progress Notes (Signed)
Subjective: Patient resting comfortably in bed. No headaches at this time. Urine output stabilized after 1 mcg of DDAVP at 1830 last night, however it urine output is again increasing, and specific gravity has decreased to 1.001. Will redosed with another 1 mcg of DDAVP. Dose last it lasted for about 14 hours.  Objective: Vital signs in last 24 hours: Filed Vitals:   09/30/11 0700 09/30/11 0800 09/30/11 0804 09/30/11 0900  BP: 112/73 103/70  115/81  Pulse: 75 70  74  Temp:   98.5 F (36.9 C)   TempSrc:   Oral   Resp: 20 16  15   Height:      Weight:      SpO2: 100% 100%  100%    Intake/Output from previous day: 06/26 0701 - 06/27 0700 In: 6777.1 [P.O.:1680; I.V.:4947.1; IV Piggyback:150] Out: 6485 [Urine:6385; Blood:100] Intake/Output this shift: Total I/O In: 1230 [P.O.:980; I.V.:250] Out: 1375 [Urine:1375]  Physical Exam:  Patient is awake alert, oriented. Extraocular movements are intact, facial movement is symmetrical. Moving all 4 extremities well. Vision is good (patient feels that the vision is improved as compared to prior surgery).  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 She is drinking substantially, and keeping up overall, but urine output is once again increasing and specific gravity decreasing, and we will give her another dose of DDAVP.  We'll check BMET again at 1400 and again it 0500 tomorrow. Patient is asked for oral analgesics, and we have ordered Percocet or Norco. Tapering Decadron.  Taking clear liquids well, will advance as tolerated. Ordered to begin out of bed, not yet done by nursing staff.   Hewitt Shorts, MD 09/30/2011, 9:47 AM

## 2011-10-01 LAB — BASIC METABOLIC PANEL
BUN: 7 mg/dL (ref 6–23)
BUN: 5 mg/dL — ABNORMAL LOW (ref 6–23)
CO2: 22 mEq/L (ref 19–32)
CO2: 20 mEq/L (ref 19–32)
Calcium: 9.2 mg/dL (ref 8.4–10.5)
Calcium: 9.7 mg/dL (ref 8.4–10.5)
Chloride: 112 mEq/L (ref 96–112)
Chloride: 112 mEq/L (ref 96–112)
Creatinine, Ser: 0.58 mg/dL (ref 0.50–1.10)
Creatinine, Ser: 0.58 mg/dL (ref 0.50–1.10)
GFR calc Af Amer: >90 mL/min (ref >90)
GFR calc Af Amer: >90 mL/min (ref >90)
GFR calc non Af Amer: >90 mL/min (ref >90)
GFR calc non Af Amer: >90 mL/min (ref >90)
Glucose, Bld: 110 mg/dL — ABNORMAL HIGH (ref 70–99)
Glucose, Bld: 147 mg/dL — ABNORMAL HIGH (ref 70–99)
Potassium: 4.1 mEq/L (ref 3.5–5.1)
Potassium: 3.8 mEq/L (ref 3.5–5.1)
Sodium: 148 mEq/L — ABNORMAL HIGH (ref 135–145)
Sodium: 148 mEq/L — ABNORMAL HIGH (ref 135–145)

## 2011-10-01 LAB — POCT URINE SPECIFIC GRAVITY, REFRACTOMETER
Specific gravity, refractometer-urine: 1 — AB (ref 1.001–1.035)
Specific gravity, refractometer-urine: 1.001 (ref 1.001–1.035)
Specific gravity, refractometer-urine: 1.002 (ref 1.001–1.035)

## 2011-10-01 MED ORDER — DOCUSATE SODIUM 100 MG PO CAPS
100.0000 mg | ORAL_CAPSULE | Freq: Two times a day (BID) | ORAL | Status: DC
  Administered 2011-10-01 – 2011-10-05 (×9): 100 mg via ORAL
  Filled 2011-10-01 (×12): qty 1

## 2011-10-01 MED ORDER — HYDROCORTISONE 10 MG PO TABS
10.0000 mg | ORAL_TABLET | Freq: Every evening | ORAL | Status: DC
  Administered 2011-10-02 – 2011-10-04 (×3): 10 mg via ORAL
  Filled 2011-10-01 (×4): qty 1

## 2011-10-01 MED ORDER — HYDROCORTISONE 20 MG PO TABS
20.0000 mg | ORAL_TABLET | Freq: Every morning | ORAL | Status: DC
  Administered 2011-10-03 – 2011-10-04 (×2): 20 mg via ORAL
  Filled 2011-10-01 (×3): qty 1

## 2011-10-01 MED ORDER — DESMOPRESSIN ACETATE 4 MCG/ML IJ SOLN
1.0000 ug | INTRAMUSCULAR | Status: AC
  Administered 2011-10-01: 1 ug via INTRAVENOUS
  Filled 2011-10-01: qty 0.25

## 2011-10-01 MED ORDER — PANTOPRAZOLE SODIUM 40 MG PO TBEC
40.0000 mg | DELAYED_RELEASE_TABLET | Freq: Every day | ORAL | Status: DC
  Administered 2011-10-01 – 2011-10-02 (×2): 40 mg via ORAL
  Filled 2011-10-01 (×2): qty 1

## 2011-10-01 NOTE — Progress Notes (Signed)
Spoke with Dr. Newell Coral regarding results of specific gravity at 1600, not able to get reading LLL, orders given ,      Jyl Heinz RN

## 2011-10-01 NOTE — Progress Notes (Signed)
Subjective: Patient resting in bed comfortably. As needed only one Norco, last night. Denies nausea. Moderate thirst, but able to keep up with by mouth intake. Specific gravity at 0730 was 1.001. Urine output increased over the past 5 hours. Sodium at 0500 was 148. Glucose improved to 110, will outpatient to have regular as well as diets sodas. Ambulated yesterday.  Objective: Vital signs in last 24 hours: Filed Vitals:   10/01/11 0400 10/01/11 0500 10/01/11 0600 10/01/11 0700  BP: 93/58 126/85 110/75 97/66  Pulse: 66 73 73 66  Temp: 98.3 F (36.8 C)     TempSrc: Oral     Resp: 12 12 13 25   Height:      Weight:      SpO2: 100% 100% 99% 100%    Intake/Output from previous day: 06/27 0701 - 06/28 0700 In: 6525 [P.O.:3525; I.V.:3000] Out: 6560 [Urine:6560] Intake/Output this shift: Total I/O In: -  Out: 800 [Urine:800]  Physical Exam:  Patient awake and alert, oriented. Extra ocular movements intact, vision good. Facial movements symmetrical. Moving all 4 extremities well. Dressing removed and wound clean and dry, incision healing nicely. Moderate swelling around the right eye.  CBC  Basename 09/30/11 0410  WBC 17.7*  HGB 11.5*  HCT 34.7*  PLT 288   BMET  Basename 10/01/11 0430 09/30/11 1430  NA 148* 142  K 4.1 3.4*  CL 112 108  CO2 22 22  GLUCOSE 110* 118*  BUN 7 5*  CREATININE 0.58 0.58  CALCIUM 9.2 8.6    Assessment/Plan: Patient stable and progressing well other than for continued diabetes insipidus. We'll decrease IV fluids to 50 cc per hour. We'll let patient keep up with by mouth intake and recheck BMET at 1600 and again in a.m., we'll hold off on DDAVP for now. Will leave Foley in place, so as to monitor strict input and output. Encouraged to ambulate in the ICU at least 4 times a day. Continuing Decadron taper, we'll begin hydrocortisone tomorrow night, with the plan to discontinue Decadron at that time. Colace ordered as per patient's request. Advancing  diet.  Hewitt Shorts, MD 10/01/2011, 8:05 AM

## 2011-10-02 LAB — BASIC METABOLIC PANEL
BUN: 9 mg/dL (ref 6–23)
CO2: 23 mEq/L (ref 19–32)
Calcium: 8.8 mg/dL (ref 8.4–10.5)
Chloride: 105 mEq/L (ref 96–112)
Creatinine, Ser: 0.64 mg/dL (ref 0.50–1.10)
GFR calc Af Amer: >90 mL/min (ref >90)
GFR calc non Af Amer: >90 mL/min (ref >90)
Glucose, Bld: 122 mg/dL — ABNORMAL HIGH (ref 70–99)
Potassium: 4 mEq/L (ref 3.5–5.1)
Sodium: 141 mEq/L (ref 135–145)

## 2011-10-02 LAB — POCT URINE SPECIFIC GRAVITY, REFRACTOMETER
Specific gravity, refractometer-urine: 1.002 (ref 1.001–1.035)
Specific gravity, refractometer-urine: 1 — AB (ref 1.001–1.035)
Specific gravity, refractometer-urine: 1 — AB (ref 1.001–1.035)
Specific gravity, refractometer-urine: 1.016 (ref 1.001–1.035)

## 2011-10-02 LAB — SODIUM: Sodium: 144 mEq/L (ref 135–145)

## 2011-10-02 MED ORDER — ZOLPIDEM TARTRATE 5 MG PO TABS
10.0000 mg | ORAL_TABLET | Freq: Every evening | ORAL | Status: DC | PRN
  Administered 2011-10-02: 10 mg via ORAL
  Filled 2011-10-02: qty 2

## 2011-10-02 MED ORDER — DESMOPRESSIN ACETATE 4 MCG/ML IJ SOLN
1.0000 ug | Freq: Once | INTRAMUSCULAR | Status: AC
  Administered 2011-10-02: 1 ug via INTRAVENOUS
  Filled 2011-10-02: qty 0.25

## 2011-10-02 NOTE — Progress Notes (Signed)
Patient ID: Marisa Galloway, female   DOB: 11-Jan-1981, 31 y.o.   MRN: [id number] Subjective: Patient reports feeling good  Objective: Vital signs in last 24 hours: Temp:  [98.2 F (36.8 C)-99 F (37.2 C)] 98.2 F (36.8 C) (06/29 0754) Pulse Rate:  [56-107] 56  (06/29 0700) Resp:  [11-23] 12  (06/29 0700) BP: (98-128)/(62-87) 105/78 mmHg (06/29 0700) SpO2:  [98 %-100 %] 100 % (06/29 0700)  Intake/Output from previous day: 06/28 0701 - 06/29 0700 In: 1308.6 [P.O.:5302; I.V.:1306.3] Out: 8745 [Urine:8745] Intake/Output this shift: Total I/O In: 272 [P.O.:222; I.V.:50] Out: 225 [Urine:225]  awake, alert, and oriented. No new neuro issues.   Lab Results:  Basename 09/30/11 0410  WBC 17.7*  HGB 11.5*  HCT 34.7*  PLT 288   BMET  Basename 10/02/11 0512 10/01/11 1631  NA 141 148*  K 4.0 3.8  CL 105 112  CO2 23 20  GLUCOSE 122* 147*  BUN 9 5*  CREATININE 0.64 0.58  CALCIUM 8.8 9.7    Studies/Results: No results found.  Assessment/Plan: Na down to 141 this AM and urine output decreasing as well. Will hold off on additional DDAVP.  LOS: 3 days  As above   Reinaldo Meeker, MD 10/02/2011, 9:17 AM

## 2011-10-03 LAB — BASIC METABOLIC PANEL
BUN: 6 mg/dL (ref 6–23)
CO2: 25 mEq/L (ref 19–32)
Calcium: 9.4 mg/dL (ref 8.4–10.5)
Chloride: 102 mEq/L (ref 96–112)
Creatinine, Ser: 0.73 mg/dL (ref 0.50–1.10)
GFR calc Af Amer: >90 mL/min (ref >90)
GFR calc non Af Amer: >90 mL/min (ref >90)
Glucose, Bld: 92 mg/dL (ref 70–99)
Potassium: 3.4 mEq/L — ABNORMAL LOW (ref 3.5–5.1)
Sodium: 142 mEq/L (ref 135–145)

## 2011-10-03 LAB — POCT URINE SPECIFIC GRAVITY, REFRACTOMETER
Specific gravity, refractometer-urine: 1 — AB (ref 1.001–1.035)
Specific gravity, refractometer-urine: 1 — AB (ref 1.001–1.035)
Specific gravity, refractometer-urine: 1 — AB (ref 1.001–1.035)
Specific gravity, refractometer-urine: 1.003 (ref 1.001–1.035)
Specific gravity, refractometer-urine: 1.017 (ref 1.001–1.035)
Specific gravity, refractometer-urine: 1.018 (ref 1.001–1.035)

## 2011-10-03 LAB — SODIUM: Sodium: 140 mEq/L (ref 135–145)

## 2011-10-03 MED ORDER — MICONAZOLE NITRATE 2 % VA CREA
1.0000 | TOPICAL_CREAM | Freq: Every day | VAGINAL | Status: DC
  Administered 2011-10-03 – 2011-10-04 (×2): 1 via VAGINAL

## 2011-10-03 MED ORDER — ACETAMINOPHEN 325 MG PO TABS
650.0000 mg | ORAL_TABLET | ORAL | Status: DC | PRN
  Administered 2011-10-03 – 2011-10-05 (×2): 650 mg via ORAL
  Filled 2011-10-03 (×2): qty 2

## 2011-10-03 MED ORDER — MICONAZOLE NITRATE 2 % VA CREA
1.0000 | TOPICAL_CREAM | Freq: Every day | VAGINAL | Status: DC
  Filled 2011-10-03: qty 45

## 2011-10-03 NOTE — Progress Notes (Signed)
Subjective: Patient resting comfortably in bed, has been up and ambulating actively in the ICU. Not excessively thirsty at this time, but has had times of thirst typically when she's having high urine outputs. Last sodium at 1330 yesterday was 144. Urine output has been stable since DDAVP dose last evening.  Objective: Vital signs in last 24 hours: Filed Vitals:   10/03/11 0500 10/03/11 0600 10/03/11 0700 10/03/11 0800  BP: 106/78 97/80 103/75 96/78  Pulse: 64 56 66 58  Temp:      TempSrc:      Resp: 15 14 15 15   Height:      Weight:      SpO2: 100% 100% 100% 100%    Intake/Output from previous day: 06/29 0701 - 06/30 0700 In: 5603.7 [P.O.:4472; I.V.:1131.7] Out: 4675 [Urine:4675] Intake/Output this shift: Total I/O In: 50 [I.V.:50] Out: 25 [Urine:25]  Physical Exam:  Awake and alert, oriented. Moving all 4 extremities well. Wound healing nicely.  BMET  Basename 10/02/11 1332 10/02/11 0512 10/01/11 1631  NA 144 141 --  K -- 4.0 3.8  CL -- 105 112  CO2 -- 23 20  GLUCOSE -- 122* 147*  BUN -- 9 5*  CREATININE -- 0.64 0.58  CALCIUM -- 8.8 9.7    Assessment/Plan: Recovering well, other than for diabetes insipidus. We'll continue to closely monitor strict inputs and outputs. Decadron taper completed, and patient transitioned to hydrocortisone at physiologic replacement doses: 20 mg every morning and 10 mg every afternoon.  We'll change IV saline lock, and DC telemetry. We'll change vital signs the every 4 hours, but continue inputs and outputs hourly and specific gravities every 4 hours. This will require continued monitoring the ICU due to strict inputs and outputs not being able to be done on a MedSurg unit reliably.  We'll check BMET this afternoon at 1500 and again in a.m.  If diabetes insipidus persists, willl begin nasal DDAVP, initially the dose of 10 mcg, probably trying to do an early evening dose, allowing her to keep up during the day with by mouth intake.  I  spoke with the patient this morning about probably needing to obtain endocrinology followup as an outpatient, surgery to see whether she is going to require long-term adrenal replacement, but also quite possibly for DI management.   Hewitt Shorts, MD 10/03/2011, 9:43 AM

## 2011-10-03 NOTE — Progress Notes (Signed)
Patient ID: Marisa Galloway, female   DOB: 01/22/1981, 31 y.o.   MRN: [id number] Subjective: Patient reports feeling fine  Objective: Vital signs in last 24 hours: Temp:  [97.5 F (36.4 C)-98.3 F (36.8 C)] 98.3 F (36.8 C) (06/30 0400) Pulse Rate:  [56-105] 58  (06/30 0800) Resp:  [11-22] 15  (06/30 0800) BP: (96-119)/(64-90) 96/78 mmHg (06/30 0800) SpO2:  [100 %] 100 % (06/30 0800)  Intake/Output from previous day: 06/29 0701 - 06/30 0700 In: 5603.7 [P.O.:4472; I.V.:1131.7] Out: 4675 [Urine:4675] Intake/Output this shift: Total I/O In: 50 [I.V.:50] Out: 25 [Urine:25]  No neuro deficits  Lab Results: No results found for this basename: WBC:2,HGB:2,HCT:2,PLT:2 in the last 72 hours BMET  Basename 10/02/11 1332 10/02/11 0512 10/01/11 1631  NA 144 141 --  K -- 4.0 3.8  CL -- 105 112  CO2 -- 23 20  GLUCOSE -- 122* 147*  BUN -- 9 5*  CREATININE -- 0.64 0.58  CALCIUM -- 8.8 9.7    Studies/Results: No results found.  Assessment/Plan: Doing well. Needed another dose of ddavp yesterday. Will continue present rx.  LOS: 4 days  As above   Reinaldo Meeker, MD 10/03/2011, 9:32 AM

## 2011-10-04 LAB — BASIC METABOLIC PANEL
BUN: 6 mg/dL (ref 6–23)
BUN: 6 mg/dL (ref 6–23)
CO2: 26 mEq/L (ref 19–32)
CO2: 24 mEq/L (ref 19–32)
Calcium: 9.5 mg/dL (ref 8.4–10.5)
Calcium: 9.7 mg/dL (ref 8.4–10.5)
Chloride: 100 mEq/L (ref 96–112)
Chloride: 103 mEq/L (ref 96–112)
Creatinine, Ser: 0.67 mg/dL (ref 0.50–1.10)
Creatinine, Ser: 0.69 mg/dL (ref 0.50–1.10)
GFR calc Af Amer: >90 mL/min (ref >90)
GFR calc Af Amer: >90 mL/min (ref >90)
GFR calc non Af Amer: >90 mL/min (ref >90)
GFR calc non Af Amer: >90 mL/min (ref >90)
Glucose, Bld: 92 mg/dL (ref 70–99)
Glucose, Bld: 109 mg/dL — ABNORMAL HIGH (ref 70–99)
Potassium: 2.8 mEq/L — ABNORMAL LOW (ref 3.5–5.1)
Potassium: 3.9 mEq/L (ref 3.5–5.1)
Sodium: 140 mEq/L (ref 135–145)
Sodium: 141 mEq/L (ref 135–145)

## 2011-10-04 LAB — POCT URINE SPECIFIC GRAVITY, REFRACTOMETER
Specific gravity, refractometer-urine: 1.0004 — AB (ref 1.001–1.035)
Specific gravity, refractometer-urine: 1.001 (ref 1.001–1.035)
Specific gravity, refractometer-urine: 1.0015 (ref 1.001–1.035)
Specific gravity, refractometer-urine: 1.0026 (ref 1.001–1.035)
Specific gravity, refractometer-urine: 1.005 (ref 1.001–1.035)
Specific gravity, refractometer-urine: 1.015 (ref 1.001–1.035)

## 2011-10-04 MED ORDER — POTASSIUM CHLORIDE CRYS ER 20 MEQ PO TBCR
40.0000 meq | EXTENDED_RELEASE_TABLET | Freq: Once | ORAL | Status: AC
  Administered 2011-10-04: 40 meq via ORAL
  Filled 2011-10-04: qty 2

## 2011-10-04 MED ORDER — POTASSIUM CHLORIDE CRYS ER 20 MEQ PO TBCR
20.0000 meq | EXTENDED_RELEASE_TABLET | Freq: Once | ORAL | Status: AC
  Administered 2011-10-04: 20 meq via ORAL
  Filled 2011-10-04: qty 1

## 2011-10-04 MED ORDER — POTASSIUM CHLORIDE CRYS ER 20 MEQ PO TBCR
40.0000 meq | EXTENDED_RELEASE_TABLET | Freq: Once | ORAL | Status: AC
  Administered 2011-10-04: 40 meq via ORAL

## 2011-10-04 MED ORDER — POTASSIUM CHLORIDE CRYS ER 20 MEQ PO TBCR
EXTENDED_RELEASE_TABLET | ORAL | Status: AC
  Administered 2011-10-04: 40 meq via ORAL
  Filled 2011-10-04: qty 2

## 2011-10-04 NOTE — Progress Notes (Signed)
Notified Dr Newell Coral regarding Pts output of 2725 and specific gravity of 1.0015. Ordered BMET. Will notify MD of results.   Cyndie Chime [place]

## 2011-10-04 NOTE — Progress Notes (Signed)
Nursing - Dr. Newell Coral made aware of pt's lab results, order for oral potassium tonight.  Will continue to assess UOP and SG.

## 2011-10-05 LAB — BASIC METABOLIC PANEL
BUN: 5 mg/dL — ABNORMAL LOW (ref 6–23)
CO2: 24 mEq/L (ref 19–32)
Calcium: 9.2 mg/dL (ref 8.4–10.5)
Chloride: 103 mEq/L (ref 96–112)
Creatinine, Ser: 0.73 mg/dL (ref 0.50–1.10)
GFR calc Af Amer: >90 mL/min (ref >90)
GFR calc non Af Amer: >90 mL/min (ref >90)
Glucose, Bld: 124 mg/dL — ABNORMAL HIGH (ref 70–99)
Potassium: 3.6 mEq/L (ref 3.5–5.1)
Sodium: 139 mEq/L (ref 135–145)

## 2011-10-05 LAB — POCT URINE SPECIFIC GRAVITY, REFRACTOMETER
Specific gravity, refractometer-urine: 1.009 (ref 1.001–1.035)
Specific gravity, refractometer-urine: 1.004 (ref 1.001–1.035)
Specific gravity, refractometer-urine: 1.006 (ref 1.001–1.035)
Specific gravity, refractometer-urine: 1.0044 (ref 1.001–1.035)
Specific gravity, refractometer-urine: 1.0036 (ref 1.001–1.035)

## 2011-10-05 MED ORDER — HYDROCORTISONE 10 MG PO TABS
10.0000 mg | ORAL_TABLET | Freq: Once | ORAL | Status: AC
  Administered 2011-10-05: 10 mg via ORAL
  Filled 2011-10-05: qty 1

## 2011-10-05 NOTE — Progress Notes (Signed)
Pts IV was removed at the beginning of the shift. Pt states she does not want another IV attempted until she speaks with Dr. Newell Coral.

## 2011-10-05 NOTE — Progress Notes (Signed)
Foley catheter d/c'd per order.  Will continue to monitor I&O and urine specific gravity, pt will continue self-monitored moderate fluid restriction.  Remaining 12 staples removed from cranial incision per order; incision clean, dry, intact, well-approximated.

## 2011-10-05 NOTE — Progress Notes (Signed)
Subjective: Patient resting in bed comfortably. Output still exceeds input, and she has been able to reduce her input to more normal amounts, rather than pushing by mouth fluids. Has been up and ambulating in ICU. Specific gravity 1.009.  Objective: Vital signs in last 24 hours: Filed Vitals:   10/04/11 1939 10/04/11 2000 10/05/11 0000 10/05/11 0400  BP:  123/72 91/62 107/69  Pulse:  61 60 64  Temp: 99.3 F (37.4 C)  97.8 F (36.6 C) 98.5 F (36.9 C)  TempSrc: Oral  Oral Oral  Resp:  14 12 14   Height:      Weight:      SpO2:  100% 97% 100%    Intake/Output from previous day: 07/01 0701 - 07/02 0700 In: 2685 [P.O.:2685] Out: 4655 [Urine:4655] Intake/Output this shift:    Physical Exam:  Awake and alert, oriented. Vision good. Extraocular movements intact. Moving all 4 extremities well. Wound healing well.  BMET  Basename 10/05/11 0500 10/04/11 1731  NA 139 141  K 3.6 3.9  CL 103 103  CO2 24 24  GLUCOSE 124* 109*  BUN 5* 6  CREATININE 0.73 0.69  CALCIUM 9.2 9.7    Assessment/Plan: Diabetes insipidus resolved. Continues on adrenal were placed with hydrocortisone, but I doubt that she has hypo-adrenal, and we'll taper hydrocortisone, given her one last dose of 10 mg by mouth this morning, and then discontinue. We'll discontinue Foley, but continued to have ICU staff maintain strict inputs and outputs, as well as specific gravities with voids. We'll recheck BMET in a.m. We'll have nursing staff remove remaining staples. Saline lock came out last night, patient would rather not have it replaced, will leave out.   Hewitt Shorts, MD 10/05/2011, 8:03 AM

## 2011-10-06 LAB — BASIC METABOLIC PANEL
BUN: 9 mg/dL (ref 6–23)
CO2: 28 mEq/L (ref 19–32)
Calcium: 9 mg/dL (ref 8.4–10.5)
Chloride: 99 mEq/L (ref 96–112)
Creatinine, Ser: 0.89 mg/dL (ref 0.50–1.10)
GFR calc Af Amer: >90 mL/min (ref >90)
GFR calc non Af Amer: 87 mL/min — ABNORMAL LOW (ref >90)
Glucose, Bld: 73 mg/dL (ref 70–99)
Potassium: 3.8 mEq/L (ref 3.5–5.1)
Sodium: 139 mEq/L (ref 135–145)

## 2011-10-06 LAB — POCT URINE SPECIFIC GRAVITY, REFRACTOMETER: Specific gravity, refractometer-urine: 1.0112 (ref 1.001–1.035)

## 2011-10-06 MED ORDER — HYDROCODONE-ACETAMINOPHEN 5-325 MG PO TABS: 1.0000 | ORAL_TABLET | ORAL | Status: AC | PRN

## 2011-10-06 NOTE — Care Management Note (Signed)
    Page 1 of 1   10/06/2011     9:17:32 AM   CARE MANAGEMENT NOTE 10/06/2011  Patient:  Marisa Galloway, Marisa Galloway   Account Number:  [id number]  Date Initiated:  10/06/2011  Documentation initiated by:  [facility]  Subjective/Objective Assessment:   ADmitted postop craniotomy for pituitary tumor.     Action/Plan:   Anticipated DC Date:  10/06/2011   Anticipated DC Plan:  HOME/SELF CARE      DC Planning Services  CM consult      Choice offered to / List presented to:             Status of service:  Completed, signed off Medicare Important Message given?   (If response is "NO", the following Medicare IM given date fields will be blank) Date Medicare IM given:   Date Additional Medicare IM given:    Discharge Disposition:  HOME/SELF CARE  Per UR Regulation:  Reviewed for med. necessity/level of care/duration of stay  If discussed at Long Length of Stay Meetings, dates discussed:   10/06/2011    Comments:

## 2011-10-06 NOTE — Discharge Summary (Signed)
Physician Discharge Summary  Patient ID: LUCINDY BOREL MRN: [id number] DOB/AGE: 1981-02-20 31 y.o.  Admit date: 09/29/2011 Discharge date: 10/06/2011  Admission Diagnoses: Pituitary tumor  Discharge Diagnoses: Pituitary tumor  Discharged Condition: good  Hospital Course: Patient was admitted underwent a right pterional craniotomy for section of pituitary tumor. A gross total resection was achieved. Postoperatively she was managed in the neurosurgical intensive care unit. She had difficulties with diabetes insipidus that were supported with DDAVP IV when necessary. The diabetes insipidus resolved, and she is now appropriately concentrating her urine. She was supported initially intraoperatively and postoperatively with Decadron, it was tapered. She was then transitioned to hydrocortisone, that was tapered and discontinued. She tolerated the tapering and discontinuation of steroid support well, without any addisonian symptoms. Her wound is healing well. The staples were removed. Neurologically she is awake and alert, oriented, vision is good, EOMs are intact, moving all 4 extremities well. She is being discharged to home with instructions given both to the patient and her mother regarding wound care and activities. She is to return for followup with me in the office in 3 weeks. Path report on the specimen described a benign pituitary adenoma.  Discharge Exam: Blood pressure 104/79, pulse 66, temperature 98.4 F (36.9 C), temperature source Oral, resp. rate 18, height 5\' 3"  (1.6 m), weight 58.6 kg (129 lb 3 oz), SpO2 100.00%.  Disposition: Home   Medication List  As of 10/06/2011  7:45 AM   TAKE these medications         drospirenone-ethinyl estradiol 3-0.02 MG tablet   Commonly known as: YAZ,GIANVI,LORYNA   Take 1 tablet by mouth daily.      HYDROcodone-acetaminophen 5-325 MG per tablet   Commonly known as: NORCO   Take 1-2 tablets by mouth every 4 (four) hours as needed for pain.     naproxen sodium 220 MG tablet   Commonly known as: ANAPROX   Take 220 mg by mouth as needed. For pain.             Signed: Hewitt Shorts, MD 10/06/2011, 7:45 AM

## 2011-10-06 NOTE — Progress Notes (Signed)
On 10-05-11 evaluated for long-term disease management services with [facility] Care Management Program. Patient will receive a post discharge transition of care call post dc as a benefit of her [facility] insurance plan. At present time Ms. Moller does not have any anticipated needs. However, [facility] Care Management will still follow-up post dc.  Raiford Noble, RN,BSN, [facility] Liaison, [phone]

## 2012-01-24 ENCOUNTER — Other Ambulatory Visit (HOSPITAL_COMMUNITY): Payer: Self-pay | Admitting: Neurosurgery

## 2012-01-24 DIAGNOSIS — D353 Benign neoplasm of craniopharyngeal duct: Secondary | ICD-10-CM

## 2012-01-24 DIAGNOSIS — D352 Benign neoplasm of pituitary gland: Secondary | ICD-10-CM

## 2012-01-26 ENCOUNTER — Other Ambulatory Visit (HOSPITAL_COMMUNITY): Payer: 59

## 2012-01-27 ENCOUNTER — Ambulatory Visit (HOSPITAL_COMMUNITY): Admission: RE | Admit: 2012-01-27 | Payer: 59 | Source: Ambulatory Visit

## 2012-01-28 ENCOUNTER — Ambulatory Visit (HOSPITAL_COMMUNITY)
Admission: RE | Admit: 2012-01-28 | Discharge: 2012-01-28 | Disposition: A | Discharge status: Home / Self Care | Payer: 59 | Source: Ambulatory Visit | Attending: Neurosurgery | Admitting: Neurosurgery

## 2012-01-28 DIAGNOSIS — D353 Benign neoplasm of craniopharyngeal duct: Secondary | ICD-10-CM

## 2012-01-28 DIAGNOSIS — D352 Benign neoplasm of pituitary gland: Secondary | ICD-10-CM | POA: Insufficient documentation

## 2012-01-28 MED ORDER — GADOBENATE DIMEGLUMINE 529 MG/ML IV SOLN
8.0000 mL | Freq: Once | INTRAVENOUS | Status: AC | PRN
  Administered 2012-01-28: 8 mL via INTRAVENOUS

## 2013-01-08 ENCOUNTER — Other Ambulatory Visit (HOSPITAL_COMMUNITY): Payer: Self-pay | Admitting: Neurosurgery

## 2013-01-08 DIAGNOSIS — D352 Benign neoplasm of pituitary gland: Secondary | ICD-10-CM

## 2013-01-19 ENCOUNTER — Ambulatory Visit (HOSPITAL_COMMUNITY)
Admission: RE | Admit: 2013-01-19 | Discharge: 2013-01-19 | Disposition: A | Discharge status: Home / Self Care | Payer: 59 | Source: Ambulatory Visit | Attending: Neurosurgery | Admitting: Neurosurgery

## 2013-01-19 DIAGNOSIS — R930 Abnormal findings on diagnostic imaging of skull and head, not elsewhere classified: Secondary | ICD-10-CM | POA: Insufficient documentation

## 2013-01-19 DIAGNOSIS — D352 Benign neoplasm of pituitary gland: Secondary | ICD-10-CM | POA: Insufficient documentation

## 2013-01-19 DIAGNOSIS — J3489 Other specified disorders of nose and nasal sinuses: Secondary | ICD-10-CM | POA: Insufficient documentation

## 2013-01-19 MED ORDER — GADOBENATE DIMEGLUMINE 529 MG/ML IV SOLN
9.0000 mL | Freq: Once | INTRAVENOUS | Status: AC | PRN
  Administered 2013-01-19: 9 mL via INTRAVENOUS

## 2014-01-21 ENCOUNTER — Encounter: Payer: Self-pay | Admitting: Internal Medicine

## 2014-01-21 ENCOUNTER — Ambulatory Visit (INDEPENDENT_AMBULATORY_CARE_PROVIDER_SITE_OTHER): Payer: 59 | Admitting: Internal Medicine

## 2014-01-21 VITALS — BP 112/64 | HR 95 | Temp 99.2°F | Resp 12 | Ht 63.0 in | Wt 141.0 lb

## 2014-01-21 DIAGNOSIS — E893 Postprocedural hypopituitarism: Secondary | ICD-10-CM | POA: Insufficient documentation

## 2014-01-21 DIAGNOSIS — E236 Other disorders of pituitary gland: Secondary | ICD-10-CM

## 2014-01-21 HISTORY — DX: Postprocedural hypopituitarism: E89.3

## 2014-01-21 MED ORDER — HYDROCORTISONE NA SUCCINATE PF 100 MG IJ SOLR
INTRAMUSCULAR | Status: DC
Start: 2014-01-21 — End: 2016-10-11

## 2014-01-21 MED ORDER — HYDROCORTISONE 5 MG PO TABS: ORAL_TABLET | ORAL | Status: DC

## 2014-01-21 MED ORDER — "SYRINGE 25G X 1"" 3 ML MISC": Status: DC

## 2014-01-21 MED ORDER — BACTERIOSTATIC WATER(BENZ ALC) IJ SOLN: INTRAMUSCULAR | Status: DC

## 2014-01-21 NOTE — Progress Notes (Signed)
Patient ID: Marisa Galloway, female   DOB: May 08, 1980, 33 y.o.   MRN: [id number]   HPI  Marisa Galloway is a 33 y.o.-year-old female, referred by her PCP, Dr. 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 She was previously seen by Dr Chalmers Cater.  Pt. 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 She had VF checked after the surgery >> normalized. Has a repeat VF in 03/2014.   Central hypothyroidism:  Levothyroxine 75 mcg, taken: - fasting, with water - separated by >60 min from b'fast  - separated by >4h from calcium, multivitamins  - no iron - no PPIs   I reviewed pt's latest thyroid tests - per records from Dr Chalmers Cater: 12/23/2011: free t4: 1.08   Central adrenal insufficiency  Hydrocortisone - 10 mg with b'fast and 10 mg at 3 pm Feels good on this dose.  Hypogonadotropic Hypogonadism  Yaz - no current pregnancy plans  High Prolactin  Bromocriptine - 4x a week - low Prolactin 04/2013, but previously high prolactin; likely 2/2 stalk compression  04/11/2013: Prolactin 0.7 12/20/2012: Prolactin <0.1 12/23/2011: Prolactin 42.5  GH deficiency  Not on [facility]  - she is not sure whether to start this or not, is unclear about the benefits  07/03/2013: IGF-I 34 (73-243) 06/21/2012: IGF-I 34 (73-243) 12/23/2011 IGF-I 19 (77-271)  The patient had a DEXA scan on 06/21/2012 showing low bone mineral density: - Z score of -0.7 at the lumbar spine - Z score of -2.0 at the left femoral neck - Z score of -2.1 at the right femoral neck  She is on Calcium carbonate - vitamin D 600/400 bid and also she gets calcium and magnesium from her other supplements.    Pt c/o: - + weight gain: 10 lbs since surgery - no fatigue - + occasional constipation - + easy bruising - no dry skin - no hair falling - no depression/anxiety  She exercises - cardio and resistance training 3x a week  ROS: Constitutional: + weight gain, no fatigue, no subjective hyperthermia/hypothermia, + sometimes poor sleep, + nocturia Eyes: no blurry vision, no xerophthalmia ENT: no sore throat, no nodules palpated in throat, no dysphagia/odynophagia, no hoarseness Cardiovascular: no CP/SOB/palpitations/leg swelling Respiratory: no cough/SOB Gastrointestinal: no N/V/D/+ occasional C - takes fiber supplements Musculoskeletal: no muscle/joint aches Skin: no rashes, + easy bruising Neurological: no tremors/numbness/tingling/dizziness Psychiatric: no depression/anxiety  Past Medical History  Diagnosis Date  . Vision changes     bilateral hemianopsia  . Pituitary tumor   . Seasonal allergies     doesn't require meds  . Headache(784.0)     occasionally   Past Surgical History  Procedure Laterality Date  . Salivary gland surgery  08/09/2007  . Craniotomy  05/18/2011    Procedure: CRANIOTOMY HYPOPHYSECTOMY TRANSNASAL APPROACH;  Surgeon: Hosie Spangle, MD;  Location: [place];  Service: Neurosurgery;  Laterality: N/A;  Transphenoidal resection of pituitary tumor with Dr Wilburn Cornelia Fat graft from abdomen  . Nasal sinus surgery  05/18/2011    Procedure: ENDOSCOPIC SINUS SURGERY WITH STEALTH;  Surgeon: Delsa Bern, MD;  Location: [facility];  Service: ENT;  Laterality: N/A;  Opening for Transphenoidal for pituitary tumor  . Brain  surgery    . Craniotomy  09/29/2011    Procedure: CRANIOTOMY TUMOR EXCISION;  Surgeon: Hosie Spangle, MD;  Location: [place] [facility];  Service: Neurosurgery;  Laterality: N/A;  Craniotomy for resection of pituitary tumor   History   Social History  . Marital Status: Single    Spouse Name: N/A    Number of Children: 0    Occupational History  . RN - [employer]   Social History Main Topics  . Smoking status: Never Smoker   . Smokeless tobacco: Not on file  . Alcohol Use: Yes     Comment: rare glass of wine  . Drug Use: No  . Sexual Activity: Yes    Birth Control/ Protection: Pill   Current Outpatient Rx  Name  Route  Sig  Dispense  Refill  . Calcium Carb-Cholecalciferol (CALCIUM-VITAMIN D) 600-400 MG-UNIT TABS   Oral   Take 1 tablet by mouth 2 (two) times daily.         . drospirenone-ethinyl estradiol (YAZ,GIANVI,LORYNA) 3-0.02 MG tablet   Oral   Take 1 tablet by mouth daily.         . hydrocortisone (CORTEF) 5 MG tablet   Oral   Take 10 mg by mouth daily with breakfast.         . levothyroxine (SYNTHROID, LEVOTHROID) 75 MCG tablet   Oral   Take 75 mcg by mouth daily before breakfast.         . Prenatal Multivit-Min-Fe-FA (PRENATAL VITAMINS PO)   Oral   Take 1 tablet by mouth daily.         . traZODone (DESYREL) 50 MG tablet   Oral   Take 50 mg by mouth at bedtime. 1 to 3 tablets as needed at bedtime.         . naproxen sodium (ANAPROX) 220 MG tablet   Oral   Take 220 mg by mouth as needed. For pain.          No Known Allergies History reviewed. No pertinent family history.  PE: BP 112/64  Pulse 95  Temp(Src) 99.2 F (37.3 C) (Oral)  Resp 12  Ht _0  (1.6 m)  Wt 141 lb (63.957 kg)  BMI 24.98 kg/m2  SpO2 98% Wt Readings from Last 3 Encounters:  01/21/14 141 lb (63.957 kg)  09/29/11 129 lb 3 oz (58.6 kg)  09/29/11 129 lb 3 oz (58.6 kg)   Constitutional: normal weight, in NAD Eyes: PERRLA, EOMI, no exophthalmos ENT: moist mucous membranes, no thyromegaly, no cervical lymphadenopathy Cardiovascular: RRR, No MRG Respiratory: CTA B Gastrointestinal: abdomen soft, NT, ND, BS+ Musculoskeletal: no deformities, strength intact in all 4 Skin: moist, warm, no rashes Neurological: no tremor with outstretched hands, DTR normal in all 4  ASSESSMENT: 1.  Hypopituitarism  PLAN:  1. Hypopituitarism A. Central hypothyroidism - she is taking the Levothyroxine correctly - We discussed correct intake of thyroid hormones, in am, separated by >30 min from breakfast, and >4h from PPI, calcium, iron, MVI. - will recheck fT4 (we cannot use TSH) B. Central adrenal insufficiency - we discussed about proper replacement with Hydrocortisone >>  I explained side effects of overreplacement on many organs in the body and the fact that we need to decrease the dose to the minimum dose that allows her to feel well - a bid dose is likely best >> will decrease her dose to 10 mg in am and 5 mg in pm, best ~3 pm - given sick days rules:  If you cannot keep anything down, including your hydrocortisone medication, please go to the emergency room or your primary care physician office to get steroids injected in the muscle or vein.  If you have a fever (more than 100 Fahrenheit), please double the dose of your hydrocortisone for the duration of the fever.  Do not run out of your hydrocortisone medication. - will send syringes and HC solution to her pharmacy for when cannot take po; also advised that she might need to go to the UC or ED if cannot give injection - advised for MedAlert bracelet mentioning "hypopituitarism". C. Hypogonadotropic hypogonadism - pt has been on OCPs for a long time, even before her craniotomy.   - continue Yaz - continue to see Dr Merwyn Katos. 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 She decided to forego starting this for now especially since the CV benefits of replacement are not clearly defined and the fact that is a daily injectable med. We may return to tx this in the future. E. No signs of DI.  Since her tumor is not a prolactinoma and her elevated prolactin was most likely from stalk compression by the tumor >> will stop Bromocriptine.  - We will check: Orders  Placed This Encounter  Procedures     . Prolactin  . Lipid Profile  . T4, free  . Comp Met (CMET)  Per her request, we will also repeat a pituitary MRI.  - Return in about 1 year (around 01/22/2015).  - time spent with the patient: 1 hour, of which >50% was spent in obtaining information about her symptoms, reviewing her previous labs, evaluations, and treatments, counseling her about her condition (please see the discussed topics above), and developing a plan to further investigate it. she had a number of questions which I addressed.  Component     Latest Ref Rng 01/22/2014  Sodium     135 - 145 mEq/L 138  Potassium     3.5 - 5.1 mEq/L 3.6  Chloride     96 - 112 mEq/L 103  CO2     19 - 32 mEq/L 18 (L)  Glucose     70 - 99 mg/dL 76  BUN     6 - 23 mg/dL 12  Creatinine     0.4 - 1.2 mg/dL 1.0  Total Bilirubin     0.2 - 1.2 mg/dL 0.4  Alkaline Phosphatase     39 - 117 U/L 58  AST     0 - 37 U/L 50 (H)  ALT     0 - 35 U/L 59 (H)  Total Protein     6.0 - 8.3 g/dL 7.8  Albumin     3.5 - 5.2 g/dL 3.6  Calcium     8.4 - 10.5 mg/dL 9.5  GFR     >60.00 mL/min 82.86  Cholesterol     0 - 200 mg/dL 193  Triglycerides     0.0 - 149.0 mg/dL 188.0 (H)  HDL     >39.00 mg/dL 53.00  VLDL     0.0 - 40.0 mg/dL 37.6  LDL (calc)     0 - 99 mg/dL 102 (H)  Total CHOL/HDL Ratio      4  NonHDL      140.00  Prolactin      0.7  Free T4     0.60 - 1.60 ng/dL 0.85  Normal free T4 >> continue LT4 75 mcg daily. Low prolactin >> continue as planned with stopping Bromocriptine. AST/ALT high >> will need to d/w PCP TG a little high.

## 2014-01-21 NOTE — Patient Instructions (Addendum)
Please continue Levothyroxine 75 mcg daily in am. Take the thyroid hormone every day, with water, >30 minutes before breakfast, separated by >4 hours from acid reflux medications, calcium, iron, multivitamins.  Try to decrease the dose of hydrocortisone to 10 mg in am and 5 mg in pm, around 3 pm. - You absolutely need to take this medication every day and not skip doses. - Please double the dose if you have a fever, for the duration of the fever. - If you cannot take anything by mouth (vomiting) or you have severe diarrhea so that you eliminate the hydrocortisone pills in your stool, please make sure that you get the hydrocortisone in the vein instead - go to the nearest emergency department/urgent care or you may go to your PCPs office. You can also inject 100 mg of your hydrocortisone solution. - Please try to get a MedAlert bracelet or pendant indicating: "Adrenal insufficiency".  Continue Yaz.  Stop Bromocriptine.  Come back for labs in am and for another visit in 1 year.  I ordered the MRI at [facility].

## 2014-01-22 ENCOUNTER — Telehealth: Payer: Self-pay | Admitting: Internal Medicine

## 2014-01-22 ENCOUNTER — Encounter: Payer: Self-pay | Admitting: Internal Medicine

## 2014-01-22 ENCOUNTER — Other Ambulatory Visit (INDEPENDENT_AMBULATORY_CARE_PROVIDER_SITE_OTHER): Payer: 59

## 2014-01-22 DIAGNOSIS — E893 Postprocedural hypopituitarism: Secondary | ICD-10-CM

## 2014-01-22 DIAGNOSIS — E236 Other disorders of pituitary gland: Secondary | ICD-10-CM

## 2014-01-22 LAB — COMPREHENSIVE METABOLIC PANEL
ALBUMIN: 3.6 g/dL (ref 3.5–5.2)
ALT: 59 U/L — AB (ref 0–35)
AST: 50 U/L — ABNORMAL HIGH (ref 0–37)
Alkaline Phosphatase: 58 U/L (ref 39–117)
BUN: 12 mg/dL (ref 6–23)
CALCIUM: 9.5 mg/dL (ref 8.4–10.5)
CHLORIDE: 103 meq/L (ref 96–112)
CO2: 18 mEq/L — ABNORMAL LOW (ref 19–32)
Creatinine, Ser: 1 mg/dL (ref 0.4–1.2)
GFR: 82.86 mL/min (ref >60.00)
Glucose, Bld: 76 mg/dL (ref 70–99)
Potassium: 3.6 mEq/L (ref 3.5–5.1)
Sodium: 138 mEq/L (ref 135–145)
Total Bilirubin: 0.4 mg/dL (ref 0.2–1.2)
Total Protein: 7.8 g/dL (ref 6.0–8.3)

## 2014-01-22 LAB — LIPID PANEL
CHOL/HDL RATIO: 4
Cholesterol: 193 mg/dL (ref 0–200)
HDL: 53 mg/dL (ref >39.00)
LDL Cholesterol: 102 mg/dL — ABNORMAL HIGH (ref 0–99)
NonHDL: 140
Triglycerides: 188 mg/dL — ABNORMAL HIGH (ref 0.0–149.0)
VLDL: 37.6 mg/dL (ref 0.0–40.0)

## 2014-01-22 LAB — T4, FREE: FREE T4: 0.85 ng/dL (ref 0.60–1.60)

## 2014-01-22 NOTE — Telephone Encounter (Signed)
Received 29 pages from [facility], sent to Dr. Cruzita Lederer. 01/22/14/ss.

## 2014-01-23 LAB — PROLACTIN: Prolactin: 0.7 ng/mL

## 2014-02-01 ENCOUNTER — Ambulatory Visit (HOSPITAL_COMMUNITY)
Admission: RE | Admit: 2014-02-01 | Discharge: 2014-02-01 | Disposition: A | Discharge status: Home / Self Care | Payer: 59 | Source: Ambulatory Visit | Attending: Radiology | Admitting: Radiology

## 2014-02-01 DIAGNOSIS — E236 Other disorders of pituitary gland: Secondary | ICD-10-CM | POA: Diagnosis not present

## 2014-02-01 DIAGNOSIS — E893 Postprocedural hypopituitarism: Secondary | ICD-10-CM | POA: Diagnosis present

## 2014-02-01 DIAGNOSIS — J349 Unspecified disorder of nose and nasal sinuses: Secondary | ICD-10-CM | POA: Insufficient documentation

## 2014-02-01 MED ORDER — GADOBENATE DIMEGLUMINE 529 MG/ML IV SOLN
9.0000 mL | Freq: Once | INTRAVENOUS | Status: AC | PRN
  Administered 2014-02-01: 9 mL via INTRAVENOUS

## 2014-02-06 ENCOUNTER — Other Ambulatory Visit (HOSPITAL_COMMUNITY): Payer: Self-pay | Admitting: Family Medicine

## 2014-02-06 DIAGNOSIS — R748 Abnormal levels of other serum enzymes: Secondary | ICD-10-CM

## 2014-02-08 ENCOUNTER — Ambulatory Visit (HOSPITAL_COMMUNITY)
Admission: RE | Admit: 2014-02-08 | Discharge: 2014-02-08 | Disposition: A | Discharge status: Home / Self Care | Payer: 59 | Source: Ambulatory Visit | Attending: Diagnostic Radiology | Admitting: Diagnostic Radiology

## 2014-02-08 DIAGNOSIS — K769 Liver disease, unspecified: Secondary | ICD-10-CM | POA: Diagnosis not present

## 2014-02-08 DIAGNOSIS — R748 Abnormal levels of other serum enzymes: Secondary | ICD-10-CM | POA: Diagnosis present

## 2014-02-08 DIAGNOSIS — K760 Fatty (change of) liver, not elsewhere classified: Secondary | ICD-10-CM | POA: Diagnosis not present

## 2014-03-31 ENCOUNTER — Encounter: Payer: Self-pay | Admitting: Internal Medicine

## 2014-04-22 ENCOUNTER — Other Ambulatory Visit: Payer: Self-pay | Admitting: *Deleted

## 2014-04-22 ENCOUNTER — Encounter: Payer: Self-pay | Admitting: Internal Medicine

## 2014-04-22 MED ORDER — LEVOTHYROXINE SODIUM 75 MCG PO TABS: 75.0000 ug | ORAL_TABLET | Freq: Every day | ORAL | Status: DC

## 2014-05-08 ENCOUNTER — Other Ambulatory Visit: Payer: Self-pay | Admitting: Family Medicine

## 2014-05-08 DIAGNOSIS — N644 Mastodynia: Secondary | ICD-10-CM

## 2014-05-16 ENCOUNTER — Ambulatory Visit
Admission: RE | Admit: 2014-05-16 | Discharge: 2014-05-16 | Disposition: A | Discharge status: Home / Self Care | Payer: 59 | Source: Ambulatory Visit | Attending: Family Medicine | Admitting: Family Medicine

## 2014-05-16 DIAGNOSIS — N644 Mastodynia: Secondary | ICD-10-CM

## 2014-10-14 ENCOUNTER — Encounter: Payer: Self-pay | Admitting: Internal Medicine

## 2014-10-14 NOTE — Progress Notes (Signed)
Received records from the [facility] from 10/09/2014 regarding patient's eye exam and visual field exam: - Vision 20/20 both eyes  - IOP 15 both eyes  - Visual fields: Complete resolution of bitemporal hemianopsia! - Follow-up: 6 months for dilated exam, OCT, refraction

## 2014-10-28 ENCOUNTER — Encounter: Payer: Self-pay | Admitting: Internal Medicine

## 2014-12-11 ENCOUNTER — Other Ambulatory Visit: Payer: Self-pay | Admitting: Internal Medicine

## 2015-01-17 ENCOUNTER — Other Ambulatory Visit: Payer: Self-pay | Admitting: Internal Medicine

## 2015-02-06 ENCOUNTER — Ambulatory Visit (INDEPENDENT_AMBULATORY_CARE_PROVIDER_SITE_OTHER): Payer: 59 | Admitting: Internal Medicine

## 2015-02-06 ENCOUNTER — Encounter: Payer: Self-pay | Admitting: Internal Medicine

## 2015-02-06 VITALS — BP 114/66 | HR 86 | Temp 97.9°F | Resp 12 | Wt 133.0 lb

## 2015-02-06 DIAGNOSIS — R350 Frequency of micturition: Secondary | ICD-10-CM

## 2015-02-06 DIAGNOSIS — E893 Postprocedural hypopituitarism: Secondary | ICD-10-CM

## 2015-02-06 DIAGNOSIS — E236 Other disorders of pituitary gland: Secondary | ICD-10-CM

## 2015-02-06 LAB — PROLACTIN: Prolactin: 20.9 ng/mL

## 2015-02-06 MED ORDER — HYDROCORTISONE 5 MG PO TABS: ORAL_TABLET | ORAL | Status: DC

## 2015-02-06 NOTE — Progress Notes (Addendum)
Patient ID: Marisa Galloway, female   DOB: 1980/11/10, 34 y.o.   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 She was previously seen by Dr Chalmers Cater. Last visit 1 year ago.  Reviewed and addended history: Pt. 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 She had VF checked after the surgery >> normalized.   [facility] records from [facility] from 10/09/2014 regarding patient's eye exam and visual field exam: - Vision 20/20 both eyes  - IOP 15 both eyes  - Visual fields: Complete resolution of bitemporal hemianopsia! - Follow-up: 6 months for dilated exam, OCT, refraction   Central hypothyroidism:  Levothyroxine 75 mcg, taken: - fasting, with water - separated by >60 min from b'fast  - separated by >4h from calcium, multivitamins  - no iron - no PPIs   Per records from Dr Chalmers Cater: 12/23/2011: free t4: 1.08   Central adrenal insufficiency  Hydrocortisone - 10 mg with b'fast and 10 mg at 3 pm >> 10 mg in am and 5 in pm Feels good on this dose.  Hypogonadotropic Hypogonadism  Yaz - no current pregnancy plans  High Prolactin  Bromocriptine - 4x a week - low Prolactin 04/2013, but previously high prolactin; likely 2/2 stalk compression  04/11/2013: Prolactin 0.7 12/20/2012: Prolactin <0.1 12/23/2011: Prolactin 42.5  GH deficiency  Not on [facility]   07/03/2013: IGF-I 34 (73-243) 06/21/2012: IGF-I 34 (73-243) 12/23/2011 IGF-I 19 (77-271)  The patient had a DEXA scan on 06/21/2012 showing low bone mineral density: - Z score of -0.7 at the lumbar spine - Z score of -2.0 at  the left femoral neck - Z score of -2.1 at the right femoral neck  We checked her pituitary labs and CMP/ lipids at last visit:  Component     Latest Ref Rng 01/22/2014  Sodium     135 - 145 mEq/L 138  Potassium     3.5 - 5.1 mEq/L 3.6  Chloride     96 - 112 mEq/L 103  CO2     19 - 32 mEq/L 18 (L)  Glucose     70 - 99 mg/dL 76  BUN     6 - 23 mg/dL 12  Creatinine     0.4 - 1.2 mg/dL 1.0  Total Bilirubin     0.2 - 1.2 mg/dL 0.4  Alkaline Phosphatase     39 - 117 U/L 58  AST     0 - 37 U/L 50 (H)  ALT     0 - 35 U/L 59 (H)  Total Protein     6.0 - 8.3 g/dL 7.8  Albumin     3.5 - 5.2 g/dL 3.6  Calcium     8.4 - 10.5 mg/dL 9.5  GFR     >60.00 mL/min 82.86  Cholesterol     0 - 200 mg/dL 193  Triglycerides     0.0 - 149.0 mg/dL 188.0 (H)  HDL     >39.00 mg/dL 53.00  VLDL     0.0 - 40.0 mg/dL 37.6  LDL (calc)     0 - 99 mg/dL 102 (  H)  Total CHOL/HDL Ratio      4  NonHDL      140.00  Prolactin      0.7  Free T4     0.60 - 1.60 ng/dL 0.85   Normal free T4 >> we continued LT4 75 mcg daily at last visit Low prolactin >> we stopped Bromocriptine at last visit AST/ALT high >> this was found to be do to fatty liver. She changed her diet and started to exercise and her LFTs normalized.  TG a little high.  She is on Calcium carbonate - vitamin D 600/400 bid and also she gets calcium and magnesium from her other supplements.   Pt mentions: - + Weight loss - no fatigue - + occasional constipation - no dry skin - no hair loss - no depression/anxiety  She started more consistent exercise.   ROS: Constitutional: + weight loss, no fatigue, no subjective hyperthermia/hypothermia, + sometimes poor sleep, + nocturia Eyes: no blurry vision, no xerophthalmia ENT: no sore throat, no nodules palpated in throat, no dysphagia/odynophagia, no hoarseness Cardiovascular: no CP/SOB/palpitations/leg swelling Respiratory: no cough/SOB Gastrointestinal: no N/V/D/+ occasional  C - takes fiber supplements Musculoskeletal: no muscle/+ joint aches Skin: no rashes Neurological: no tremors/numbness/tingling/dizziness  I reviewed pt's medications, allergies, PMH, social hx, family hx, and changes were documented in the history of present illness. Otherwise, unchanged from my initial visit note.  Past Medical History  Diagnosis Date  . Vision changes     bilateral hemianopsia  . Pituitary tumor ([place])   . Seasonal allergies     doesn't require meds  . Headache(784.0)     occasionally  . Hypopituitarism after adenoma resection ([place]) 01/21/2014   Past Surgical History  Procedure Laterality Date  . Salivary gland surgery  08/09/2007  . Craniotomy  05/18/2011    Procedure: CRANIOTOMY HYPOPHYSECTOMY TRANSNASAL APPROACH;  Surgeon: Hosie Spangle, MD;  Location: [facility];  Service: Neurosurgery;  Laterality: N/A;  Transphenoidal resection of pituitary tumor with Dr Wilburn Cornelia Fat graft from abdomen  . Nasal sinus surgery  05/18/2011    Procedure: ENDOSCOPIC SINUS SURGERY WITH STEALTH;  Surgeon: Delsa Bern, MD;  Location: [facility];  Service: ENT;  Laterality: N/A;  Opening for Transphenoidal for pituitary tumor  . Brain surgery    . Craniotomy  09/29/2011    Procedure: CRANIOTOMY TUMOR EXCISION;  Surgeon: Hosie Spangle, MD;  Location: [place] [facility];  Service: Neurosurgery;  Laterality: N/A;  Craniotomy for resection of pituitary tumor   History   Social History  . Marital Status: Single    Spouse Name: N/A    Number of Children: 0   Occupational History  . RN - [employer]   Social History Main Topics  . Smoking status: Never Smoker   . Smokeless tobacco: Not on file  . Alcohol Use: Yes     Comment: rare glass of wine  . Drug Use: No  . Sexual Activity: Yes    Birth Control/ Protection: Pill   Current Outpatient Rx  Name  Route  Sig  Dispense  Refill  . Calcium Carb-Cholecalciferol (CALCIUM-VITAMIN D) 600-400 MG-UNIT TABS   Oral   Take 1  tablet by mouth 2 (two) times daily.         . drospirenone-ethinyl estradiol (YAZ,GIANVI,LORYNA) 3-0.02 MG tablet   Oral   Take 1 tablet by mouth daily.         . hydrocortisone (CORTEF) 5 MG tablet  Take 10 mg in am and 5 mg in pm   150 tablet   3   . levothyroxine (SYNTHROID, LEVOTHROID) 75 MCG tablet   Oral   Take 1 tablet (75 mcg total) by mouth daily before breakfast. **PT NEEDS APPT FOR FURTHER REFILLS**   90 tablet   0     patient requesting 90 day supply please   . Multiple Vitamin (MULTIVITAMIN) tablet   Oral   Take 1 tablet by mouth daily.         . naproxen sodium (ANAPROX) 220 MG tablet   Oral   Take 220 mg by mouth as needed. For pain.         . Syringe/Needle, Disp, (SYRINGE 3CC/25GX1") 25G X 1" 3 ML MISC      Inject in the muscle as needed - use for solucortef   50 each   prn   . Water For Inject [employer] (WATER, BACTERIOSTATIC) injection      Bacteriostatic water - 2 mL vials - mix 2 mL with 100 mg HC and inject solution im over 30 seconds.   10 mL   2   . hydrocortisone sodium succinate (SOLU-CORTEF) 100 MG SOLR injection      Please resuspend in bacteriostatic water and inject if you cannot take the hydrocortisone medication by mouth. Patient not taking: Reported on 02/06/2015   2 each   prn    No Known Allergies No family history on file.  PE: BP 114/66 mmHg  Pulse 86  Temp(Src) 97.9 F (36.6 C) (Oral)  Resp 12  Wt 133 lb (60.328 kg)  SpO2 96% Body mass index is 23.57 kg/(m^2). Wt Readings from Last 3 Encounters:  02/06/15 133 lb (60.328 kg)  01/21/14 141 lb (63.957 kg)  09/29/11 129 lb 3 oz (58.6 kg)   Constitutional: normal weight, in NAD Eyes: PERRLA, EOMI, no exophthalmos ENT: moist mucous membranes, no thyromegaly, no cervical lymphadenopathy Cardiovascular: RRR, No MRG Respiratory: CTA B Gastrointestinal: abdomen soft, NT, ND, BS+ Musculoskeletal: no deformities, strength intact in all 4 Skin: moist,  warm, no rashes Neurological: no tremor with outstretched hands, DTR normal in all 4  ASSESSMENT: 1. Hypopituitarism - Visual field normalized: [facility]/2014 regarding patient's eye exam and visual field exam: - Vision 20/20 both eyes  - IOP 15 both eyes  - Visual fields: Complete resolution of bitemporal hemianopsia! - Follow-up: 6 months for dilated exam, OCT, refraction   2. Low bone mineral density  PLAN:  1. Hypopituitarism A. Central hypothyroidism - On levothyroxine 75 g daily - We discussed correct intake of thyroid hormones, in am, separated by >30 min from breakfast, and >4h from PPI, calcium, iron, MVI. She is taking it correctly - will recheck fT4 (we cannot use TSH) B. Central adrenal insufficiency - At last visit, we decreased her hydrocortisone dose to 10 mg in am and 5 mg in pm. She is doing well on this dose, without significant fatigue. She also lost 7 pounds since last visit. This is partly due to her increased exercise. - Reinforced sick days rules:  If you cannot keep anything down, including your hydrocortisone medication, please go to the emergency room or your primary care physician office to get steroids injected in the muscle or vein.  If you have a fever (more than 100 Fahrenheit), please double the dose of your hydrocortisone for the duration of the fever.  Do not run out of your hydrocortisone  medication. - She has hydrocortisone syringes but did not have to use them - She has a MedAlert bracelet mentioning "adrenal insufficiency". C. Hypogonadotropic hypogonadism - pt has been on OCPs for a long time, even before her craniotomy.   - continue Yaz - continue to see Dr Merwyn Katos. [place] insufficiency - pt with low IGF1 - we again discussed about the indication for [facility] tx and possible benefits and SEs. She decided to forego starting this for now especially since the CV benefits of replacement are not clearly defined  and the fact that is a daily injectable med. We may return to tx this in the future. E. No signs of DI.  She had more urination lately, but without thirst. She has nights in which he doesn't wake up at all to urinate, but she has nights in which she has nocturia 2. We will check a hemoglobin A1c today.  2. Low bone mineral density - We reviewed together her DEXA scan from 2014. Her Z scores were low, however, I suspect that these improved with decreasing the dose of hydrocortisone, decreasing her prolactin level, improving exercise. - We'll repeat the DEXA scan this year  - Today, we will check: Orders Placed This Encounter  Procedures     . Prolactin  . T4, free  . HbA1c  No need for a repeat a pituitary MRI for now, we reviewed the last one from 01/2014.  - Return in about 1 year (around 02/06/2016).  - time spent with the patient: 40 minutes, of which >50% was spent in obtaining information about her endocrine conditions described above, reviewing her previous labs, evaluations, and treatments, counseling her about her conditions (please see the discussed topics above), and developing a plan to further investigate and treat them.  Office Visit on 02/06/2015  Component Date Value Ref Range Status  . Hgb A1c MFr Bld 02/06/2015 5.8  4.6 - 6.5 % Final   Glycemic Control Guidelines for People with Diabetes:Non Diabetic:  <6%Goal of Therapy: <7%Additional Action Suggested:  >8%   . Prolactin 02/06/2015 20.9   Final   Comment:      Reference Ranges:                  Female:                       2.1 -  17.1 ng/ml                  Female:   Pregnant          9.7 - 208.5 ng/mL                            Non Pregnant      2.8 -  29.2 ng/mL                            Post Menopausal   1.8 -  20.3 ng/mL                      . Free T4 02/06/2015 0.80  0.60 - 1.60 ng/dL Final   Hemoglobin A1c slightly higher than normal range. At this low levels, the test is not very accurate. I will repeat the  test when she comes back in a year. Free T4 is normal, will continue the same dose of levothyroxine. I'm surprised about  the normal prolactin, would like to repeat this in 2 months to make sure he does not increase.  Date of study: 02/14/2015 Exam: DUAL X-RAY ABSORPTIOMETRY (DXA) FOR BONE MINERAL DENSITY (BMD) Instrument: [employer] Requesting Provider: Dr. Cruzita Lederer Indication: follow up for low BMD, history of hypopituitarism after pituitary adenoma resection Comparison: none (please note that it is not possible to compare data from different instruments) Clinical data: Pt is a premenopausal 34 y.o. female without previous h/o fracture. On calcium and vitamin D.  Results:  Lumbar spine (L1-L4) Femoral neck (FN)  Z-score -0.3  RFN: -1.8 LFN: -2.3   Assessment: the BMD is low according to the [facility] classification for osteoporosis (see below). Fracture risk: moderate Comments: the technical quality of the study is good. Evaluation for secondary causes should be considered if clinically indicated.  Recommend optimizing calcium (1200 mg/day) and vitamin D (800 IU/day) intake.  Followup: Repeat BMD is appropriate after 2 years.   Msg sent: Dear Ms Bobbye Charleston, The bone density is still a little low, especially at the left hip. The right hip appears improved from the previous check, however, we cannot directly compare bone densities analyzed on different machines. I would take this is a new baseline, and continue exercise and calcium and vitamin D replacement until we can recheck it in 2 years. Osteoporosis medications are not without side effects, so I would like to check another bone density to see if it improves before thinking about maybe start medicines. One question that I had is whether you had a vitamin D level checked. If not, I would suggest to have this, and I can order it here or you can have it done by her primary care doctor. Please let me know. Sincerely, Philemon Kingdom MD

## 2015-02-06 NOTE — Patient Instructions (Signed)
Please stop at the lab.  Please return in 1 year.  

## 2015-02-07 LAB — HEMOGLOBIN A1C: Hgb A1c MFr Bld: 5.8 % (ref 4.6–6.5)

## 2015-02-07 LAB — T4, FREE: Free T4: 0.8 ng/dL (ref 0.60–1.60)

## 2015-02-11 ENCOUNTER — Telehealth: Payer: Self-pay | Admitting: *Deleted

## 2015-02-11 NOTE — Telephone Encounter (Signed)
Scheduled pt's Bone density exam for Friday, Nov 11th at 11:30 am. Pt is aware of date and time.

## 2015-02-13 ENCOUNTER — Other Ambulatory Visit: Payer: 59

## 2015-02-13 ENCOUNTER — Telehealth: Payer: Self-pay | Admitting: Internal Medicine

## 2015-02-13 MED ORDER — HYDROCORTISONE 5 MG PO TABS: ORAL_TABLET | ORAL | Status: DC

## 2015-02-13 NOTE — Telephone Encounter (Signed)
Patient called stating she would like a refill on her Rx  Rx: Hydrocortisone   Pharmacy: [facility]    Thank you

## 2015-02-14 ENCOUNTER — Ambulatory Visit (INDEPENDENT_AMBULATORY_CARE_PROVIDER_SITE_OTHER)
Admission: RE | Admit: 2015-02-14 | Discharge: 2015-02-14 | Disposition: A | Discharge status: Home / Self Care | Payer: 59 | Source: Ambulatory Visit | Attending: Internal Medicine | Admitting: Internal Medicine

## 2015-02-14 DIAGNOSIS — E236 Other disorders of pituitary gland: Secondary | ICD-10-CM | POA: Diagnosis not present

## 2015-02-21 ENCOUNTER — Other Ambulatory Visit: Payer: Self-pay | Admitting: Internal Medicine

## 2015-02-21 ENCOUNTER — Encounter: Payer: Self-pay | Admitting: Internal Medicine

## 2015-02-21 DIAGNOSIS — E559 Vitamin D deficiency, unspecified: Secondary | ICD-10-CM

## 2015-02-21 DIAGNOSIS — M8580 Other specified disorders of bone density and structure, unspecified site: Secondary | ICD-10-CM

## 2015-02-21 DIAGNOSIS — M859 Disorder of bone density and structure, unspecified: Secondary | ICD-10-CM

## 2015-04-11 DIAGNOSIS — H40013 Open angle with borderline findings, low risk, bilateral: Secondary | ICD-10-CM | POA: Diagnosis not present

## 2015-04-11 DIAGNOSIS — H5319 Other subjective visual disturbances: Secondary | ICD-10-CM | POA: Diagnosis not present

## 2015-04-11 DIAGNOSIS — D352 Benign neoplasm of pituitary gland: Secondary | ICD-10-CM | POA: Diagnosis not present

## 2015-04-11 DIAGNOSIS — H5213 Myopia, bilateral: Secondary | ICD-10-CM | POA: Diagnosis not present

## 2015-04-18 ENCOUNTER — Other Ambulatory Visit (INDEPENDENT_AMBULATORY_CARE_PROVIDER_SITE_OTHER): Payer: 59

## 2015-04-18 DIAGNOSIS — E893 Postprocedural hypopituitarism: Secondary | ICD-10-CM

## 2015-04-18 DIAGNOSIS — E236 Other disorders of pituitary gland: Secondary | ICD-10-CM | POA: Diagnosis not present

## 2015-04-18 DIAGNOSIS — E559 Vitamin D deficiency, unspecified: Secondary | ICD-10-CM | POA: Diagnosis not present

## 2015-04-18 LAB — VITAMIN D 25 HYDROXY (VIT D DEFICIENCY, FRACTURES): VITD: 57.41 ng/mL (ref 30.00–100.00)

## 2015-04-19 LAB — PROLACTIN: Prolactin: 24.9 ng/mL

## 2015-05-19 MED FILL — GIANVI 3-0.02 MG TABS: 3-0.02 | 21 days supply | Qty: 28 | Fill #0

## 2015-05-26 MED FILL — HYDROCORTISONE 5 MG TABLET: 5 | 90 days supply | Qty: 270 | Fill #2

## 2015-06-04 DIAGNOSIS — Z01419 Encounter for gynecological examination (general) (routine) without abnormal findings: Secondary | ICD-10-CM | POA: Diagnosis not present

## 2015-06-11 MED FILL — GIANVI 3-0.02 MG TABS: 3-0.02 | 63 days supply | Qty: 84 | Fill #0

## 2015-06-16 ENCOUNTER — Other Ambulatory Visit: Payer: Self-pay | Admitting: Endocrinology

## 2015-06-16 MED ORDER — LEVOTHYROXINE SODIUM 75 MCG PO TABS: 75.0000 ug | ORAL_TABLET | Freq: Every day | ORAL | Status: DC

## 2015-06-16 MED FILL — LEVOTHYROXINE 75 MCG TABLET: 75 | 90 days supply | Qty: 90 | Fill #0

## 2015-06-23 ENCOUNTER — Encounter: Payer: Self-pay | Admitting: Internal Medicine

## 2015-06-24 ENCOUNTER — Other Ambulatory Visit: Payer: Self-pay | Admitting: Internal Medicine

## 2015-06-24 DIAGNOSIS — E893 Postprocedural hypopituitarism: Secondary | ICD-10-CM

## 2015-06-24 MED ORDER — LEVOTHYROXINE SODIUM 75 MCG PO TABS: 75.0000 ug | ORAL_TABLET | Freq: Every day | ORAL | Status: DC

## 2015-08-22 ENCOUNTER — Other Ambulatory Visit: Payer: Self-pay | Admitting: Internal Medicine

## 2015-08-22 MED FILL — HYDROCORTISONE 5 MG TABLET: 5 | 90 days supply | Qty: 270 | Fill #0

## 2015-08-22 MED FILL — GIANVI 3-0.02 MG TABS: 3-0.02 | 72 days supply | Qty: 84 | Fill #1

## 2015-08-22 NOTE — Telephone Encounter (Signed)
Spoke with pharmacy, they did not receive the refill from this AM. Resubmitted the refill for the hydrocortisone for pt.

## 2015-09-10 MED FILL — LEVOTHYROXINE 75 MCG TABLET: 75 | 90 days supply | Qty: 90 | Fill #0

## 2015-10-10 DIAGNOSIS — H40023 Open angle with borderline findings, high risk, bilateral: Secondary | ICD-10-CM | POA: Diagnosis not present

## 2015-10-10 DIAGNOSIS — D352 Benign neoplasm of pituitary gland: Secondary | ICD-10-CM | POA: Diagnosis not present

## 2015-11-01 ENCOUNTER — Encounter: Payer: Self-pay | Admitting: Internal Medicine

## 2015-11-03 MED FILL — GIANVI 3-0.02 MG TABS: 3-0.02 | 72 days supply | Qty: 84 | Fill #2

## 2015-11-28 ENCOUNTER — Other Ambulatory Visit: Payer: Self-pay | Admitting: Internal Medicine

## 2015-11-28 MED FILL — HYDROCORTISONE 5 MG TABLET: 5 | 90 days supply | Qty: 270 | Fill #1

## 2015-12-10 MED FILL — LEVOTHYROXINE 75 MCG TABLET: 75 | 90 days supply | Qty: 90 | Fill #1

## 2015-12-18 ENCOUNTER — Encounter: Payer: Self-pay | Admitting: Internal Medicine

## 2015-12-29 DIAGNOSIS — Z111 Encounter for screening for respiratory tuberculosis: Secondary | ICD-10-CM | POA: Diagnosis not present

## 2016-01-08 MED FILL — GIANVI 3-0.02 MG TABS: 3-0.02 | 72 days supply | Qty: 84 | Fill #3

## 2016-01-16 ENCOUNTER — Other Ambulatory Visit (HOSPITAL_COMMUNITY): Payer: Self-pay | Admitting: Neurosurgery

## 2016-01-16 DIAGNOSIS — D352 Benign neoplasm of pituitary gland: Secondary | ICD-10-CM

## 2016-01-26 ENCOUNTER — Ambulatory Visit (HOSPITAL_COMMUNITY)
Admission: RE | Admit: 2016-01-26 | Discharge: 2016-01-26 | Disposition: A | Discharge status: Home / Self Care | Payer: 59 | Source: Ambulatory Visit | Attending: Neurosurgery | Admitting: Neurosurgery

## 2016-01-26 ENCOUNTER — Ambulatory Visit (HOSPITAL_COMMUNITY): Admission: RE | Admit: 2016-01-26 | Payer: 59 | Source: Ambulatory Visit

## 2016-01-26 DIAGNOSIS — D352 Benign neoplasm of pituitary gland: Secondary | ICD-10-CM | POA: Diagnosis not present

## 2016-01-26 MED ORDER — GADOBENATE DIMEGLUMINE 529 MG/ML IV SOLN
10.0000 mL | Freq: Once | INTRAVENOUS | Status: AC | PRN
  Administered 2016-01-26: 9 mL via INTRAVENOUS

## 2016-01-28 ENCOUNTER — Encounter: Payer: Self-pay | Admitting: Internal Medicine

## 2016-02-06 ENCOUNTER — Ambulatory Visit (INDEPENDENT_AMBULATORY_CARE_PROVIDER_SITE_OTHER): Payer: 59 | Admitting: Internal Medicine

## 2016-02-06 ENCOUNTER — Encounter: Payer: Self-pay | Admitting: Internal Medicine

## 2016-02-06 VITALS — BP 104/68 | HR 92 | Ht 63.0 in | Wt 136.0 lb

## 2016-02-06 DIAGNOSIS — M8580 Other specified disorders of bone density and structure, unspecified site: Secondary | ICD-10-CM

## 2016-02-06 DIAGNOSIS — M859 Disorder of bone density and structure, unspecified: Secondary | ICD-10-CM

## 2016-02-06 DIAGNOSIS — E559 Vitamin D deficiency, unspecified: Secondary | ICD-10-CM

## 2016-02-06 DIAGNOSIS — E236 Other disorders of pituitary gland: Secondary | ICD-10-CM

## 2016-02-06 DIAGNOSIS — E893 Postprocedural hypopituitarism: Secondary | ICD-10-CM

## 2016-02-06 LAB — VITAMIN D 25 HYDROXY (VIT D DEFICIENCY, FRACTURES): VITD: 59.36 ng/mL (ref 30.00–100.00)

## 2016-02-06 LAB — HEMOGLOBIN A1C: HEMOGLOBIN A1C: 5.7 % (ref 4.6–6.5)

## 2016-02-06 LAB — T3, FREE: T3, Free: 4 pg/mL (ref 2.3–4.2)

## 2016-02-06 LAB — T4, FREE: FREE T4: 0.69 ng/dL (ref 0.60–1.60)

## 2016-02-06 NOTE — Progress Notes (Signed)
Patient ID: Marisa Galloway, female   DOB: [birth year], 35 y.o.   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 She was previously seen by Dr Marisa Galloway. Last visit 1 year ago.  Reviewed and addended history: Pt. has been dx with a nonsecreting pituitary adenoma in 04/2011 >> noticed a change in vision >> changed glasses >> vision still deteriorating >> VF abnormal >> MRI pituitary >> pituitary tumor >> 05/2011 TSR >> 09/2011 craniotomy by Dr Marisa Galloway as there was still remnant pituitary tumor in the sella. 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 She had VF checked after the surgery >> normalized.   10/10/2015: Eye and visual field exam: - Vision 20/20 both eyes  - IOP 15 both eyes  - Visual fields: Complete resolution of bitemporal hemianopsia!  She had any MRI before this visit, on 01/26/2016: No change from the prior study. Postop pituitary resection. No residual pituitary tissue identified. Infundibulum not identified. No significant abnormality in the remainder of the brain.  Central hypothyroidism:  Levothyroxine 75 mcg, taken: - fasting, with water - separated by >60 min from b'fast  - separated by >4h from calcium, multivitamins  - no iron - no PPIs   Free T4 levels: 02/06/2015 free T4 0.80 Per records from Dr Marisa Galloway: 12/23/2011: free t4: 1.08   Central adrenal insufficiency  Hydrocortisone - 10 mg with b'fast and 10 mg at 3 pm >> 10 mg in am and 5 in pm Feels good on this dose. Had to increase the dose for fever last 03/2016.  Hypogonadotropic Hypogonadism  Yaz - no current pregnancy plans  High Prolactin  Previously on Bromocriptine, stopped in 2015 - low Prolactin 04/2013, but previously high prolactin; likely 2/2 stalk compression 04/18/2015: Prolactin 24.9 02/06/2015: Prolactin 20.9 04/11/2013: Prolactin 0.7 12/20/2012:  Prolactin <0.1 12/23/2011: Prolactin 42.5  GH deficiency  Not on Gov Marisa Galloway   07/03/2013: IGF-I 34 (73-243) 06/21/2012: IGF-I 34 (73-243) 12/23/2011 IGF-I 19 (77-271)  Pt also had low BMD for age:   Lumbar spine (L1-L4)Z score Femoral neck (FN) Z score  02/18/2015 -0.3  RFN: -1.8 LFN: -2.3  06/21/2012  -0.7 RFN: -2.1 LFN: -2.0   I sent her the following message: The bone density is still a little low, especially at the left hip. The right hip appears improved from the previous check, however, we cannot directly compare bone densities analyzed on different machines. I would take this is a new baseline, and continue exercise and calcium and vitamin D replacement until we can recheck it in 2 years.   She is on Calcium carbonate - vitamin D 600/400 bid + vit D 1000 units daily and also she gets calcium and magnesium from her other supplements. Latest vitamin D level was normal: Lab Results  Component Value Date   VD25OH 57.41 04/18/2015   Pt mentions: - + Weight loss - no fatigue - + occasional constipation - no dry skin - no hair loss - no depression/anxiety  She started more consistent exercise.   ROS: Constitutional: + weight loss, no fatigue, no subjective hyperthermia/hypothermia, + sometimes poor sleep, + nocturia Eyes: no blurry vision, no xerophthalmia ENT: no sore throat, no nodules palpated in throat, no dysphagia/odynophagia, no hoarseness Cardiovascular: no CP/SOB/palpitations/leg swelling Respiratory: no cough/SOB Gastrointestinal: no N/V/D/+ occasional C - takes fiber supplements Musculoskeletal: no muscle/+ joint aches Skin: no rashes Neurological: no tremors/numbness/tingling/dizziness  I  reviewed pt's medications, allergies, PMH, social hx, family hx, and changes were documented in the history of present illness. Otherwise, unchanged from my initial visit note.  Past Medical History:  Diagnosis Date  . Headache(784.0)    occasionally  . Hypopituitarism  after adenoma resection ([place]) 01/21/2014  . Pituitary tumor   . Seasonal allergies    doesn't require meds  . Vision changes    bilateral hemianopsia   Past Surgical History:  Procedure Laterality Date  . BRAIN SURGERY    . CRANIOTOMY  05/18/2011   Procedure: CRANIOTOMY HYPOPHYSECTOMY TRANSNASAL APPROACH;  Surgeon: Marisa Spangle, MD;  Location: [place] [facility];  Service: Neurosurgery;  Laterality: N/A;  Transphenoidal resection of pituitary tumor with Dr Marisa Galloway Fat graft from abdomen  . CRANIOTOMY  09/29/2011   Procedure: CRANIOTOMY TUMOR EXCISION;  Surgeon: Marisa Spangle, MD;  Location: [facility];  Service: Neurosurgery;  Laterality: N/A;  Craniotomy for resection of pituitary tumor  . NASAL SINUS SURGERY  05/18/2011   Procedure: ENDOSCOPIC SINUS SURGERY WITH STEALTH;  Surgeon: Marisa Bern, MD;  Location: [facility];  Service: ENT;  Laterality: N/A;  Opening for Transphenoidal for pituitary tumor  . SALIVARY GLAND SURGERY  08/09/2007   History   Social History  . Marital Status: Single    Spouse Name: N/A    Number of Children: 0   Occupational History  . RN - Marisa Galloway   Social History Main Topics  . Smoking status: Never Smoker   . Smokeless tobacco: Not on file  . Alcohol Use: Yes     Comment: rare glass of wine  . Drug Use: No  . Sexual Activity: Yes    Birth Control/ Protection: Pill   Current Outpatient Prescriptions on File Prior to Visit  Medication Sig Dispense Refill  . Calcium Carb-Cholecalciferol (CALCIUM-VITAMIN D) 600-400 MG-UNIT TABS Take 1 tablet by mouth 2 (two) times daily.    . drospirenone-ethinyl estradiol (YAZ,GIANVI,LORYNA) 3-0.02 MG tablet Take 1 tablet by mouth daily.    . hydrocortisone (CORTEF) 5 MG tablet TAKE 2 TABLETS BY MOUTH IN THE MORNING AND 1 TABLET IN THE EVENING 150 tablet 3  . hydrocortisone (CORTEF) 5 MG tablet TAKE 2 TABLETS BY MOUTH IN THE MORNING AND 1 TABLET IN THE EVENING 150 tablet 3  . hydrocortisone sodium  succinate (SOLU-CORTEF) 100 MG SOLR injection Please resuspend in bacteriostatic water and inject if you cannot take the hydrocortisone medication by mouth. (Patient not taking: Reported on 02/06/2015) 2 each prn  . levothyroxine (SYNTHROID, LEVOTHROID) 75 MCG tablet Take 1 tablet (75 mcg total) by mouth daily before breakfast. 90 tablet 3  . Multiple Vitamin (MULTIVITAMIN) tablet Take 1 tablet by mouth daily.    . naproxen sodium (ANAPROX) 220 MG tablet Take 220 mg by mouth as needed. For pain.    . Syringe/Needle, Disp, (SYRINGE 3CC/25GX1") 25G X 1" 3 ML MISC Inject in the muscle as needed - use for solucortef 50 each prn  . Water For Inject [employer] (WATER, BACTERIOSTATIC) injection Bacteriostatic water - 2 mL vials - mix 2 mL with 100 mg HC and inject solution im over 30 seconds. 10 mL 2   No current facility-administered medications on file prior to visit.     No Known Allergies No family history on file.  PE: BP 104/68 (BP Location: Left Arm, Patient Position: Sitting)   Pulse 92   Ht 5\' 3"  (1.6 m)   Wt 136 lb (61.7 kg)  SpO2 97%   BMI 24.09 kg/m  Body mass index is 24.09 kg/m. Wt Readings from Last 3 Encounters:  02/06/16 136 lb (61.7 kg)  02/06/15 133 lb (60.3 kg)  01/21/14 141 lb (64 kg)   Constitutional: normal weight, in NAD Eyes: PERRLA, EOMI, no exophthalmos ENT: moist mucous membranes, no thyromegaly, no cervical lymphadenopathy Cardiovascular: RRR, No MRG Respiratory: CTA B Gastrointestinal: abdomen soft, NT, ND, BS+ Musculoskeletal: no deformities, strength intact in all 4 Skin: moist, warm, no rashes Neurological: no tremor with outstretched hands, DTR normal in all 4  ASSESSMENT: 1. Hypopituitarism - Visual field normalized: VF from 10/10/2015: Complete resolution of bitemporal hemianopsia!  2. Low bone mineral density  PLAN:  1. Hypopituitarism - Recent MRI showed no recurrence of her pituitary tumor A. Central hypothyroidism - On levothyroxine  75 g daily - taking her thyroid hormone in am, separated by >30 min from breakfast, and >4h from PPI, calcium, iron, MVI. - will recheck fT4 (we cannot use TSH) B. Central adrenal insufficiency - On hydrocortisone 10 mg in am and 5 mg in pm. She is doing well on this dose, without significant fatigue. She only had to double the dose once since last visit for fever. - she is aware of sick days rules:  If you cannot keep anything down, including your hydrocortisone medication, please go to the emergency room or your primary care physician office to get steroids injected in the muscle or vein.  If you have a fever (more than 100 Fahrenheit), please double the dose of your hydrocortisone for the duration of the fever.  Do not run out of your hydrocortisone medication. - She has hydrocortisone syringes but did not have to use them - She has a MedAlert bracelet mentioning "adrenal insufficiency". C. Hypogonadotropic hypogonadism - pt has been on OCPs for a Galloway time, even before her craniotomy.   - continue Yaz - continue to see Dr Merwyn Katos Berry insufficiency - pt with low IGF1 - at last visits, we did discuss about the indication for [facility] tx and possible benefits and SEs. She decided to forego starting this for now especially since the CV benefits of replacement are not clearly defined and the fact that is a daily injectable med.  E. No signs of DI.  - no increased urination or thirst.   We will check a hemoglobin A1c today as it was slightly high before..  2. Low bone mineral density - We reviewed together her DEXA scan from 2014 and 2016. Her Z scores were low, however, she increased exercise and has a normal vitamin D level - We'll repeat the DEXA scan in one more year. If lower, we discussed she may need a bisphosphonate, possibly Actonel.  - Today, we will check: Orders Placed This Encounter  Procedures    Vitamin D   . Prolactin  . T4, free  . HbA1c   Component     Latest Ref  Rng & Units 02/06/2016  Prolactin     ng/mL 18.2  T4,Free(Direct)     0.60 - 1.60 ng/dL 0.69  Hemoglobin A1C     4.6 - 6.5 % 5.7  VITD     30.00 - 100.00 ng/mL 59.36  Triiodothyronine,Free,Serum     2.3 - 4.2 pg/mL 4.0   Labs are normal, however, free T4 is slightly low, despite the fact that patient to her levothyroxine the morning of the lab draw. We'll increase her dose to 88 g daily and repeat her TFTs in  1.5 months.  Philemon Kingdom, MD PhD [facility] Endocrinology

## 2016-02-06 NOTE — Patient Instructions (Signed)
Please stop at the lab.  Please continue current Levothyroxine and hydrocortisone dose.  Please return in 1 year.

## 2016-02-07 LAB — PROLACTIN: PROLACTIN: 18.2 ng/mL

## 2016-02-09 ENCOUNTER — Encounter: Payer: Self-pay | Admitting: Internal Medicine

## 2016-02-09 MED ORDER — LEVOTHYROXINE SODIUM 88 MCG PO TABS: 88.0000 ug | ORAL_TABLET | Freq: Every day | ORAL | 5 refills | Status: DC

## 2016-02-09 MED FILL — LEVOTHYROXINE 88 MCG TABLET: 88 | 30 days supply | Qty: 30 | Fill #0

## 2016-03-10 ENCOUNTER — Other Ambulatory Visit: Payer: Self-pay | Admitting: Internal Medicine

## 2016-03-10 MED FILL — LEVOTHYROXINE 88 MCG TABLET: 88 | 90 days supply | Qty: 90 | Fill #1

## 2016-03-10 MED FILL — HYDROCORTISONE 5 MG TABLET: 5 | 90 days supply | Qty: 270 | Fill #0

## 2016-03-26 MED FILL — GIANVI 3-0.02 MG TABS: 3-0.02 | 84 days supply | Qty: 112 | Fill #0

## 2016-04-08 ENCOUNTER — Other Ambulatory Visit (INDEPENDENT_AMBULATORY_CARE_PROVIDER_SITE_OTHER): Payer: 59

## 2016-04-08 DIAGNOSIS — E236 Other disorders of pituitary gland: Secondary | ICD-10-CM

## 2016-04-08 DIAGNOSIS — E893 Postprocedural hypopituitarism: Secondary | ICD-10-CM

## 2016-04-08 LAB — T4, FREE: FREE T4: 0.89 ng/dL (ref 0.60–1.60)

## 2016-04-08 LAB — T3, FREE: T3 FREE: 3 pg/mL (ref 2.3–4.2)

## 2016-04-13 DIAGNOSIS — D352 Benign neoplasm of pituitary gland: Secondary | ICD-10-CM | POA: Diagnosis not present

## 2016-04-13 DIAGNOSIS — H40023 Open angle with borderline findings, high risk, bilateral: Secondary | ICD-10-CM | POA: Diagnosis not present

## 2016-04-23 DIAGNOSIS — Z0000 Encounter for general adult medical examination without abnormal findings: Secondary | ICD-10-CM | POA: Diagnosis not present

## 2016-04-23 DIAGNOSIS — K760 Fatty (change of) liver, not elsewhere classified: Secondary | ICD-10-CM | POA: Diagnosis not present

## 2016-04-23 DIAGNOSIS — E893 Postprocedural hypopituitarism: Secondary | ICD-10-CM | POA: Diagnosis not present

## 2016-04-25 ENCOUNTER — Encounter: Payer: Self-pay | Admitting: Internal Medicine

## 2016-05-07 MED FILL — traZODone HCL 50 MG TABS: 50 | 30 days supply | Qty: 90 | Fill #0

## 2016-06-07 MED FILL — LEVOTHYROXINE 88 MCG TABLET: 88 | 90 days supply | Qty: 90 | Fill #2

## 2016-07-01 MED FILL — HYDROCORTISONE 5 MG TABLET: 5 | 90 days supply | Qty: 270 | Fill #1

## 2016-07-02 MED FILL — GIANVI 3-0.02 MG TABS: 3-0.02 | 21 days supply | Qty: 28 | Fill #0

## 2016-07-07 IMAGING — MR MR HEAD WO/W CM
10 of 15 series · 31 of 48 positions shown · IV contrast (multihance)
Comparison: MRI brain 01/19/2013

CLINICAL DATA: Hypopituitarism after adenoma resection.

EXAM:
MRI HEAD WITHOUT AND WITH CONTRAST
TECHNIQUE: Multiplanar, multiecho pulse sequences of the brain and surrounding
structures were obtained without and with intravenous contrast.
CONTRAST:  9mL MULTIHANCE GADOBENATE DIMEGLUMINE 529 MG/ML IV SOLN

[Series 2: T1 · sagittal · 5.0mm · 0.47mm/px · 2 of 24 slices shown]
[im 1/24]
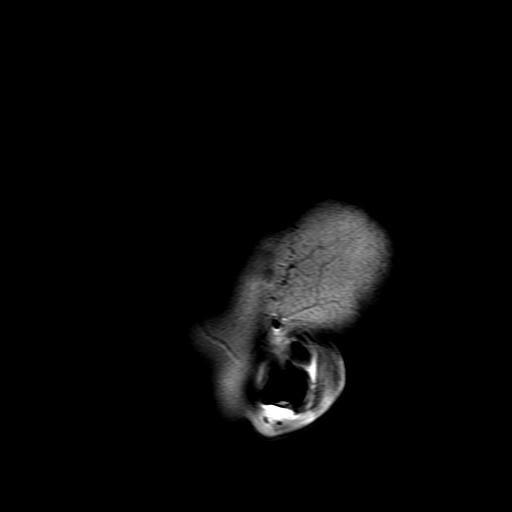
[im 12/24]
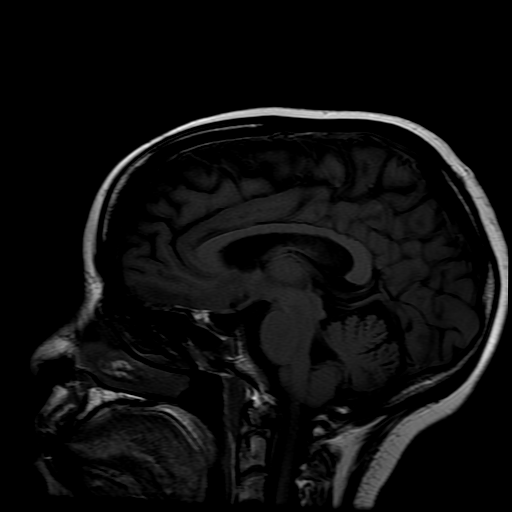

[Series 4: DWI · axial · 5.0mm · 1.09mm/px · z∈[-76,+61]mm · 6 of 60 slices shown (1 of 4)]
[im 1/60]
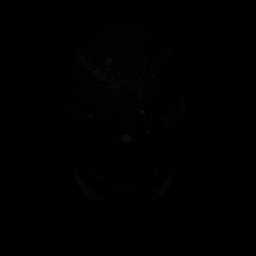
[im 12/60]
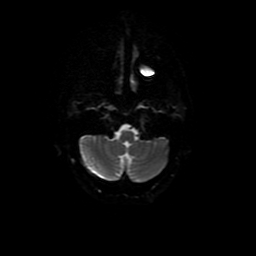
[im 24/60]
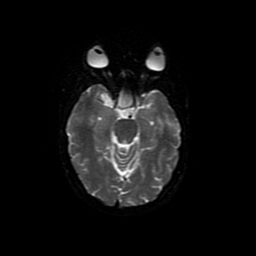
[im 36/60]
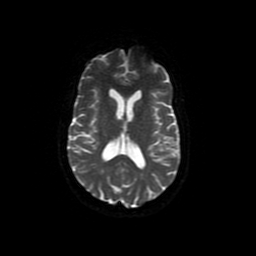
[im 48/60]
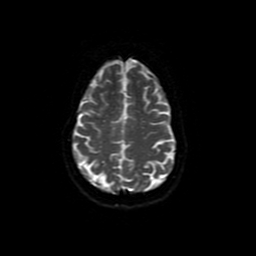
[im 60/60]
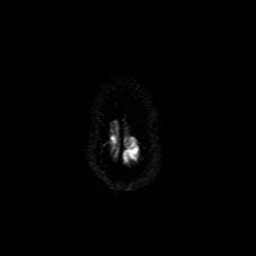

[Series 5: DWI · coronal · 5.0mm · 1.09mm/px · 6 of 60 slices shown (2 of 4)]
[im 1/60]
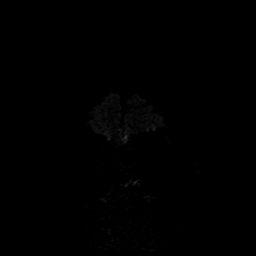
[im 12/60]
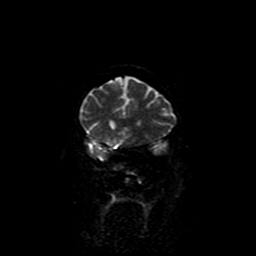
[im 24/60]
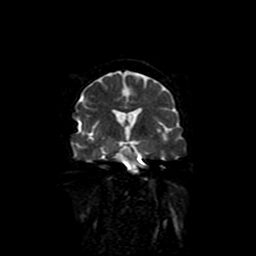
[im 36/60]
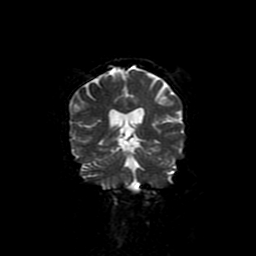
[im 48/60]
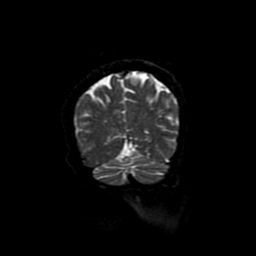
[im 60/60]
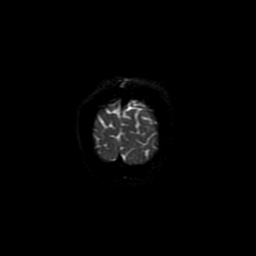

[Series 6: T2 · axial · 5.0mm · 0.43mm/px · z∈[-86,+64]mm · 3 of 24 slices shown]
[im 1/24]
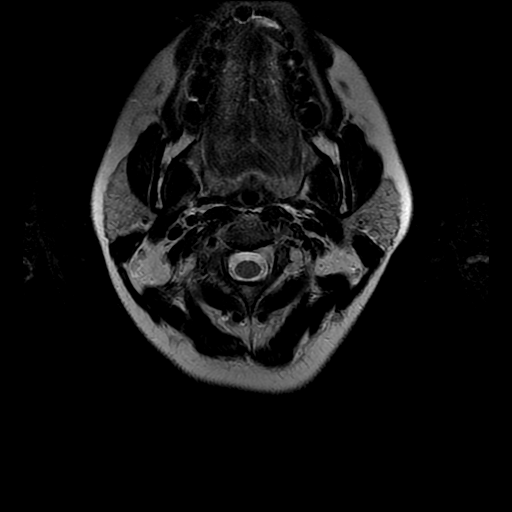
[im 12/24]
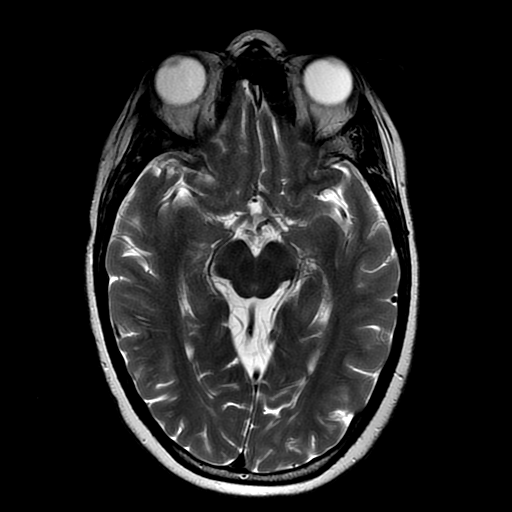
[im 24/24]
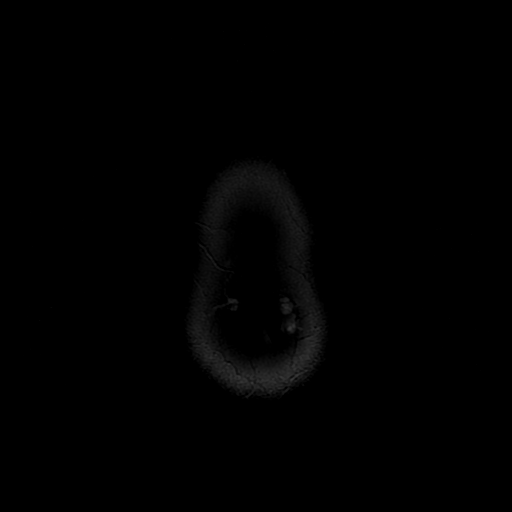

[Series 8: FLAIR · axial · 5.0mm · 0.43mm/px · z∈[-86,+64]mm · 3 of 24 slices shown]
[im 1/24]
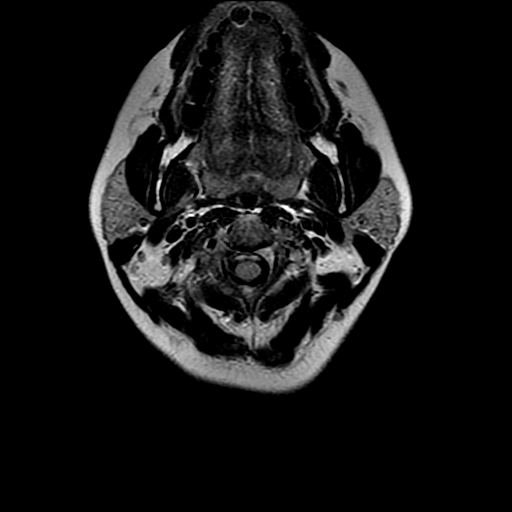
[im 12/24]
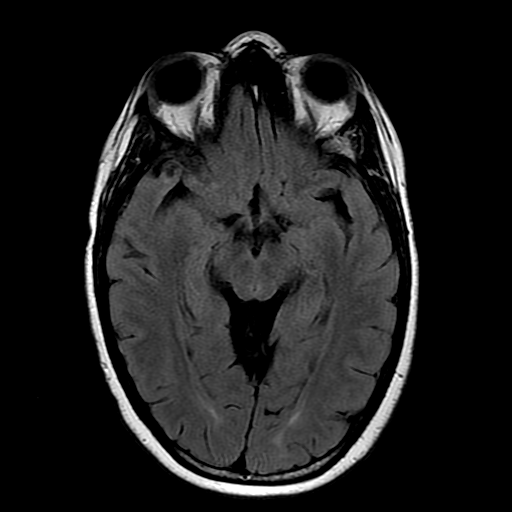
[im 24/24]
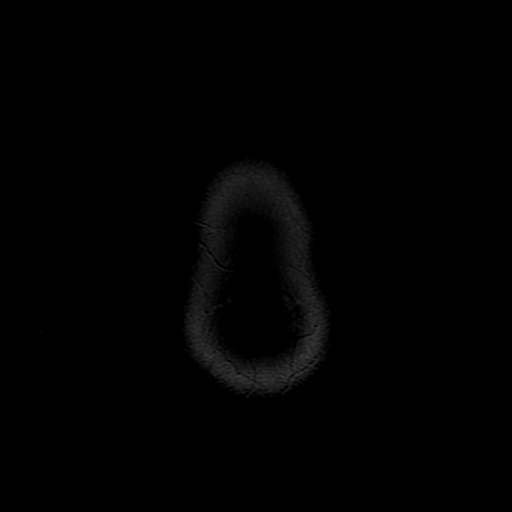

[Series 14: T1 post-contrast · sagittal · 3.0mm · 0.35mm/px · 1 of 12 slices shown (1 of 3)]
[im 1/12]
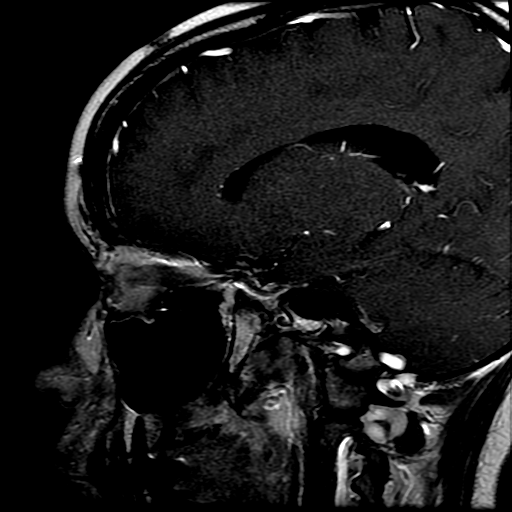

[Series 15: T1 post-contrast · coronal · 3.0mm · 0.35mm/px · 1 of 12 slices shown (2 of 3)]
[im 1/12]
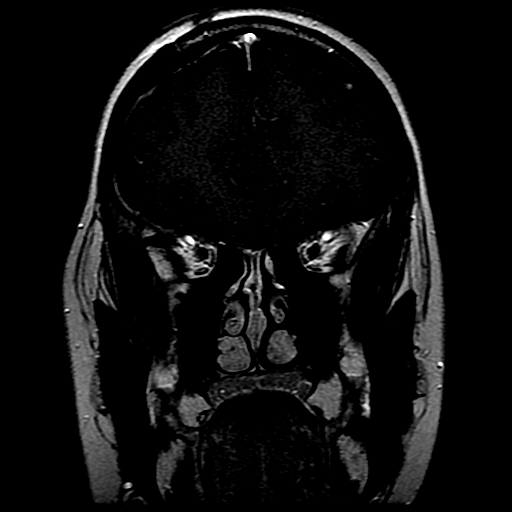

[Series 16: T1 post-contrast · coronal · 5.0mm · 0.45mm/px · 3 of 24 slices shown (3 of 3)]
[im 1/24]
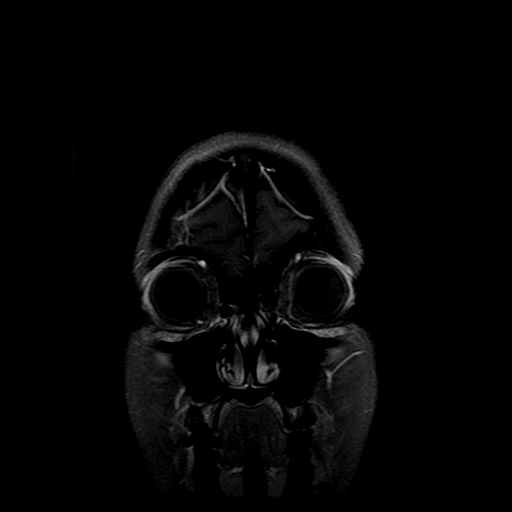
[im 12/24]
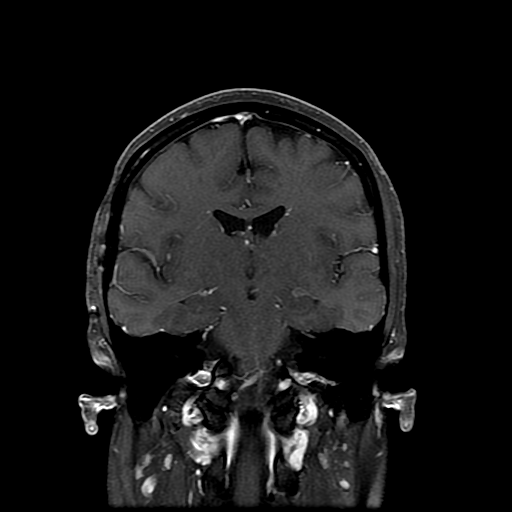
[im 24/24]
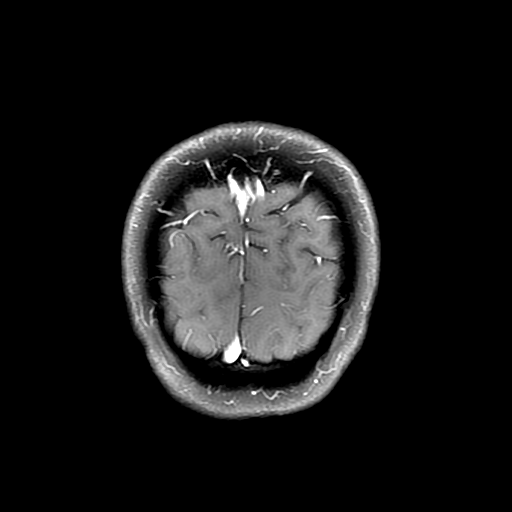

[Series 400: DWI · axial · 5.0mm · 1.09mm/px · z∈[-76,+61]mm · 3 of 30 slices shown (3 of 4)]
[im 1/30]
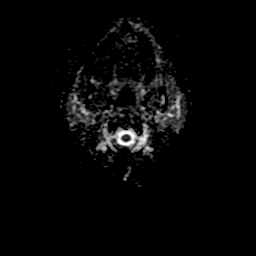
[im 15/30]
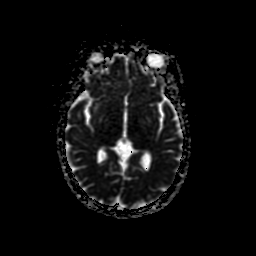
[im 30/30]
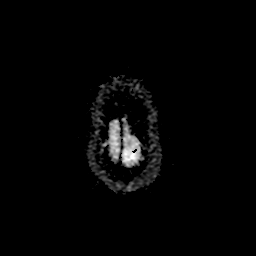

[Series 500: DWI · coronal · 5.0mm · 1.09mm/px · 3 of 30 slices shown (4 of 4)]
[im 1/30]
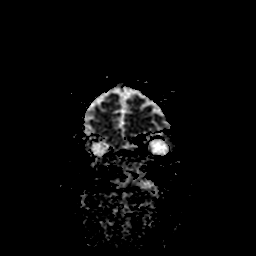
[im 15/30]
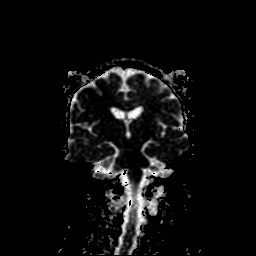
[im 30/30]
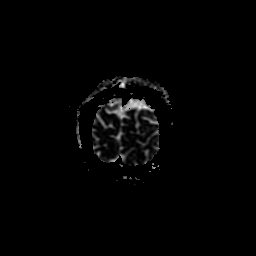

[31 of 48 positions shown; findings below may reference images not displayed]

FINDINGS: The patient is status post right frontal craniotomy. The sella
turcica is empty. No acute infarct, hemorrhage, or mass lesion is
present. The ventricles are of normal size. No significant
extraaxial fluid collection is present. No significant white matter
disease is present.

Flow is present in the major intracranial arteries. A polyp or
mucous retention cyst is evident posteriorly in the left maxillary
sinus. The remaining paranasal sinuses and the mastoid air cells are
clear.

Dedicated imaging of the sella demonstrates no residual or recurrent
pituitary tissue. The optic chiasm is within normal limits. The
cavernous sinus is normal bilaterally.

Postcontrast imaging through the remainder the brain is
unremarkable. A benign venous angioma is again seen within the left
cerebellum.
IMPRESSION: 1. Stable appearance of an empty sella without evidence for residual
or recurrent pituitary tissue or tumor.
2. Otherwise normal MRI appearance of the brain.
3. Stable polyp or mucous retention cyst within the left maxillary
sinus.

## 2016-07-23 MED FILL — GIANVI 3-0.02 MG TABS: 3-0.02 | 28 days supply | Qty: 28 | Fill #0

## 2016-08-23 MED FILL — GIANVI 3-0.02 MG TABS: 3-0.02 | 21 days supply | Qty: 28 | Fill #0

## 2016-08-31 ENCOUNTER — Other Ambulatory Visit: Payer: Self-pay | Admitting: Internal Medicine

## 2016-08-31 MED FILL — LEVOTHYROXINE 88 MCG TABLET: 88 | 90 days supply | Qty: 90 | Fill #0

## 2016-09-06 DIAGNOSIS — R87612 Low grade squamous intraepithelial lesion on cytologic smear of cervix (LGSIL): Secondary | ICD-10-CM | POA: Diagnosis not present

## 2016-09-06 DIAGNOSIS — Z01419 Encounter for gynecological examination (general) (routine) without abnormal findings: Secondary | ICD-10-CM | POA: Diagnosis not present

## 2016-09-06 DIAGNOSIS — Z6822 Body mass index (BMI) 22.0-22.9, adult: Secondary | ICD-10-CM | POA: Diagnosis not present

## 2016-09-06 DIAGNOSIS — Z1151 Encounter for screening for human papillomavirus (HPV): Secondary | ICD-10-CM | POA: Diagnosis not present

## 2016-09-15 MED FILL — HYDROCORTISONE 5 MG TABLET: 5 | 90 days supply | Qty: 270 | Fill #0

## 2016-09-15 MED FILL — GIANVI 3-0.02 MG TABS: 3-0.02 | 84 days supply | Qty: 84 | Fill #0

## 2016-10-11 ENCOUNTER — Other Ambulatory Visit: Payer: Self-pay | Admitting: Internal Medicine

## 2016-10-11 ENCOUNTER — Other Ambulatory Visit: Payer: Self-pay

## 2016-10-11 ENCOUNTER — Encounter: Payer: Self-pay | Admitting: Internal Medicine

## 2016-10-11 MED ORDER — HYDROCORTISONE NA SUCCINATE PF 100 MG IJ SOLR: INTRAMUSCULAR | 99 refills | Status: DC

## 2016-10-11 MED ORDER — BACTERIOSTATIC WATER(BENZ ALC) IJ SOLN: INTRAMUSCULAR | 99 refills | Status: DC

## 2016-10-11 MED FILL — SOLU-CORTEF (PF) 100 MG VIA: 100 | 2 days supply | Qty: 2 | Fill #0

## 2016-10-12 ENCOUNTER — Other Ambulatory Visit: Payer: Self-pay | Admitting: Internal Medicine

## 2016-10-12 ENCOUNTER — Telehealth: Payer: Self-pay | Admitting: Internal Medicine

## 2016-10-12 MED ORDER — "SYRINGE 25G X 1"" 3 ML MISC": 99 refills | Status: AC

## 2016-10-12 MED ORDER — HYDROCORTISONE NA SUCCINATE PF 100 MG IJ SOLR: INTRAMUSCULAR | 99 refills | Status: DC

## 2016-10-12 NOTE — Telephone Encounter (Signed)
Pharmacy calling to clarify directions. She states that the  ndc was for the solu cortef actovial. But the directions state to reconsitute with bacteriostatic water. This is not needed for the [facility] ordered. Please clarify.

## 2016-10-12 NOTE — Telephone Encounter (Signed)
Called and notified pharmacy of MD note. No other questions at this time.

## 2016-10-12 NOTE — Telephone Encounter (Signed)
If they gave her the solution, no pb, she does not need to resuspend it. Will change in her medlist.

## 2016-10-12 NOTE — Telephone Encounter (Signed)
Can you please advise, these were submitted yesterday, not sure of these medications

## 2016-10-13 DIAGNOSIS — H52223 Regular astigmatism, bilateral: Secondary | ICD-10-CM | POA: Diagnosis not present

## 2016-10-13 DIAGNOSIS — H40023 Open angle with borderline findings, high risk, bilateral: Secondary | ICD-10-CM | POA: Diagnosis not present

## 2016-10-13 DIAGNOSIS — H5213 Myopia, bilateral: Secondary | ICD-10-CM | POA: Diagnosis not present

## 2016-10-13 DIAGNOSIS — D352 Benign neoplasm of pituitary gland: Secondary | ICD-10-CM | POA: Diagnosis not present

## 2016-11-26 MED FILL — GIANVI 3-0.02 MG TABS: 3-0.02 | 84 days supply | Qty: 112 | Fill #0

## 2016-11-29 MED FILL — LEVOTHYROXINE 88 MCG TABLET: 88 | 90 days supply | Qty: 90 | Fill #1

## 2017-02-01 ENCOUNTER — Encounter: Payer: Self-pay | Admitting: Internal Medicine

## 2017-02-04 ENCOUNTER — Encounter: Payer: Self-pay | Admitting: Internal Medicine

## 2017-02-04 ENCOUNTER — Telehealth: Payer: Self-pay

## 2017-02-04 ENCOUNTER — Other Ambulatory Visit: Payer: Self-pay | Admitting: Internal Medicine

## 2017-02-04 ENCOUNTER — Ambulatory Visit (INDEPENDENT_AMBULATORY_CARE_PROVIDER_SITE_OTHER): Payer: 59 | Admitting: Internal Medicine

## 2017-02-04 VITALS — BP 110/70 | HR 71 | Temp 97.7°F | Ht 63.0 in | Wt 125.6 lb

## 2017-02-04 DIAGNOSIS — M8580 Other specified disorders of bone density and structure, unspecified site: Secondary | ICD-10-CM

## 2017-02-04 DIAGNOSIS — R7309 Other abnormal glucose: Secondary | ICD-10-CM | POA: Diagnosis not present

## 2017-02-04 DIAGNOSIS — E236 Other disorders of pituitary gland: Secondary | ICD-10-CM

## 2017-02-04 DIAGNOSIS — E559 Vitamin D deficiency, unspecified: Secondary | ICD-10-CM

## 2017-02-04 DIAGNOSIS — E893 Postprocedural hypopituitarism: Secondary | ICD-10-CM

## 2017-02-04 DIAGNOSIS — M859 Disorder of bone density and structure, unspecified: Secondary | ICD-10-CM

## 2017-02-04 LAB — VITAMIN D 25 HYDROXY (VIT D DEFICIENCY, FRACTURES): VITD: 55.96 ng/mL (ref 30.00–100.00)

## 2017-02-04 LAB — T3, FREE: T3 FREE: 4.2 pg/mL (ref 2.3–4.2)

## 2017-02-04 LAB — HEMOGLOBIN A1C: Hgb A1c MFr Bld: 5.7 % (ref 4.6–6.5)

## 2017-02-04 LAB — T4, FREE: FREE T4: 1.01 ng/dL (ref 0.60–1.60)

## 2017-02-04 NOTE — Progress Notes (Addendum)
Patient ID: Marisa Galloway, female   DOB: 02-16-1981, 36 y.o.   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 She was previously seen by Dr Chalmers Cater. Last visit with me 1 year ago.   She lost 11 pounds since last visit through increased exercise at [facility] - 3x a week.  Reviewed and addended history: Pt. 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 She had VF checked after the surgery >> normalised.   10/10/2015: Eye and visual field exam: - Vision 20/20 both eyes  - IOP 15 both eyes  - Visual fields: Complete resolution of bitemporal hemianopsia!  Pituitary MRI (01/26/2016): No change from the prior study. Postop pituitary resection. No residual pituitary tissue identified. Infundibulum not identified. No significant abnormality in the remainder of the brain.  Central hypothyroidism:  Levothyroxine 75 >> increased to 88 mcg at last visit. She takes this: - in am - fasting - at least 30 min from b'fast - no Ca, Fe, MVI, PPIs - not on Biotin   Free T4 levels - normal Lab Results  Component Value Date   FREET4 0.89 04/08/2016   FREET4 0.69 02/06/2016   FREET4 0.80 02/06/2015   FREET4 0.85 01/22/2014  Per records from Dr Chalmers Cater: 12/23/2011: free t4: 1.08   Central adrenal insufficiency  Hydrocortisone - 10 mg with b'fast and 10 mg at 3 pm >> 10 mg in am and 5 mg in pm now  - No complaints. 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 - NO current pregnancy  plans  High Prolactin  Previously on Bromocriptine, which we stopped in 2015 - low Prolactin 04/2013, but previously high prolactin; likely 2/2 stalk compression Lab Results  Component Value Date   PROLACTIN 18.2 02/06/2016   PROLACTIN 24.9 04/18/2015   PROLACTIN 20.9 02/06/2015   PROLACTIN 0.7 01/22/2014   PROLACTIN 41.6 05/17/2011   04/11/2013: Prolactin 0.7 12/20/2012: Prolactin <0.1 12/23/2011: Prolactin 42.5  GH deficiency  Not on [facility]  07/03/2013: IGF-I 34 (73-243) 06/21/2012: IGF-I 34 (73-243) 12/23/2011 IGF-I 19 (77-271)  Pt also had low BMD for age:   Lumbar spine (L1-L4)Z score Femoral neck (FN) Z score  02/18/2015 ([place]) -0.3  RFN: -1.8 LFN: -2.3  06/21/2012  -0.7 RFN: -2.1 LFN: -2.0  The right hip appears improved from the previous check, however, we cannot directly compare bone densities analyzed on different machines.   She is on Calcium carbonate - vitamin D 600/400 bid + vit D 1000 units daily.  Levels have been normal: Lab Results  Component Value Date   VD25OH 59.36 02/06/2016   VD25OH 57.41 04/18/2015   She continues to exercise regularly.  I have also been following her HbA1c levels per her request - these were in the low prediabetic range: Lab Results  Component Value Date   HGBA1C 5.7 02/06/2016   HGBA1C 5.8 02/06/2015   She continues to drink one soda a day, for the caffeine. She does not like coffee or tea.  ROS: Constitutional: + weight loss, no fatigue, no subjective hyperthermia, no subjective hypothermia Eyes: no blurry vision, no xerophthalmia ENT: no sore throat, no nodules palpated in throat, no dysphagia, no odynophagia, no hoarseness Cardiovascular: no CP/no SOB/no palpitations/no leg swelling Respiratory: no cough/no SOB/no wheezing Gastrointestinal: no N/no V/no D/no C/no acid reflux Musculoskeletal: no muscle aches/no joint aches Skin: no rashes, no hair loss Neurological: no tremors/no numbness/no tingling/no  dizziness  I reviewed pt's medications, allergies, PMH, social hx, family hx, and changes were documented in the history of present illness. Otherwise, unchanged from my initial visit note.  Past Medical History:  Diagnosis Date  . Headache(784.0)    occasionally  . Hypopituitarism after adenoma resection ([place]) 01/21/2014  . Pituitary tumor   . Seasonal allergies    doesn't require meds  . Vision changes    bilateral hemianopsia   Past Surgical History:  Procedure Laterality Date  . BRAIN SURGERY    . CRANIOTOMY  05/18/2011   Procedure: CRANIOTOMY HYPOPHYSECTOMY TRANSNASAL APPROACH;  Surgeon: Hosie Spangle, MD;  Location: [place] [facility];  Service: Neurosurgery;  Laterality: N/A;  Transphenoidal resection of pituitary tumor with Dr Wilburn Cornelia Fat graft from abdomen  . CRANIOTOMY  09/29/2011   Procedure: CRANIOTOMY TUMOR EXCISION;  Surgeon: Hosie Spangle, MD;  Location: [place] [facility];  Service: Neurosurgery;  Laterality: N/A;  Craniotomy for resection of pituitary tumor  . NASAL SINUS SURGERY  05/18/2011   Procedure: ENDOSCOPIC SINUS SURGERY WITH STEALTH;  Surgeon: Delsa Bern, MD;  Location: [facility];  Service: ENT;  Laterality: N/A;  Opening for Transphenoidal for pituitary tumor  . SALIVARY GLAND SURGERY  08/09/2007   History   Social History  . Marital Status: Single    Spouse Name: N/A    Number of Children: 0   Occupational History  . RN - [employer]   Social History Main Topics  . Smoking status: Never Smoker   . Smokeless tobacco: Not on file  . Alcohol Use: Yes     Comment: rare glass of wine  . Drug Use: No  . Sexual Activity: Yes    Birth Control/ Protection: Pill   Current Outpatient Prescriptions on File Prior to Visit  Medication Sig Dispense Refill  . Calcium Carb-Cholecalciferol (CALCIUM-VITAMIN D) 600-400 MG-UNIT TABS Take 1 tablet by mouth 2 (two) times daily.    . Cholecalciferol (VITAMIN D PO) Take 1,000 Units by mouth.    .  drospirenone-ethinyl estradiol (YAZ,GIANVI,LORYNA) 3-0.02 MG tablet Take 1 tablet by mouth daily.    . hydrocortisone (CORTEF) 5 MG tablet TAKE 2 TABLETS BY MOUTH EVERY MORNING AND 1 TABLET IN THE EVENING 150 tablet 3  . hydrocortisone sodium succinate (SOLU-CORTEF) 100 MG SOLR injection Please inject in the muscle if you cannot take the hydrocortisone medication by mouth. 2 each prn  . levothyroxine (SYNTHROID, LEVOTHROID) 88 MCG tablet TAKE 1 TABLET BY MOUTH ONCE DAILY 45 tablet 5  . Multiple Vitamin (MULTIVITAMIN) tablet Take 1 tablet by mouth daily.    . naproxen sodium (ANAPROX) 220 MG tablet Take 220 mg by mouth as needed. For pain.    . Syringe/Needle, Disp, (SYRINGE 3CC/25GX1") 25G X 1" 3 ML MISC Inject in the muscle as needed - use for solucortef 50 each prn   No current facility-administered medications on file prior to visit.     No Known Allergies No family history on file.  PE: BP 110/70 (BP Location: Left Arm, Patient Position: Sitting, Cuff Size: Normal)  Pulse 71   Temp 97.7 F (36.5 C) (Oral)   Ht 5\' 3"  (1.6 m)   Wt 125 lb 9.6 oz (57 kg)   SpO2 96%   BMI 22.25 kg/m  Body mass index is 22.25 kg/m. Wt Readings from Last 3 Encounters:  02/04/17 125 lb 9.6 oz (57 kg)  02/06/16 136 lb (61.7 kg)  02/06/15 133 lb (60.3 kg)   Constitutional: normal weight, in NAD Eyes: PERRLA, EOMI, no exophthalmos ENT: moist mucous membranes, no thyromegaly, no cervical lymphadenopathy Cardiovascular: RRR, No MRG Respiratory: CTA B Gastrointestinal: abdomen soft, NT, ND, BS+ Musculoskeletal: no deformities, strength intact in all 4 Skin: moist, warm, no rashes Neurological: no tremor with outstretched hands, DTR normal in all 4  ASSESSMENT: 1. Hypopituitarism - Visual field normalized: VF from 10/10/2015: Complete resolution of bitemporal hemianopsia!  2. Low bone mineral density for age  PLAN:  1. Hypopituitarism - Recent MRI (2017) showed no recurrence of pituitary tumor.  No need to repeat the MRI for now. We may need another MRI in 1-2 years. A. Central hypothyroidism - On levothyroxine 88 g daily (Increased at last visit) - latest thyroid labs reviewed with pt >> normal but fT4 was a little low >> dose increased - pt feels good on this dose. - we discussed about taking the thyroid hormone every day, with water, >30 minutes before breakfast, separated by >4 hours from acid reflux medications, calcium, iron, multivitamins. Pt. is taking it correctly - will check thyroid tests today: fT3 and fT4 (We cannot use the TSH)   B. Central adrenal insufficiency - On hydrocortisone 10 mg in am and 5 mg in pm. Feels well, w/o signif. Fatigue. - she is aware of sick day rules:  If you cannot keep anything down, including your hydrocortisone medication, please go to the emergency room or your primary care physician office to get steroids injected in the muscle or vein.  If you have a fever (more than 100 Fahrenheit), please double the dose of your hydrocortisone for the duration of the fever.  Do not run out of your hydrocortisone medication. - She did not have to use im or iv or 2x doses of PO HC since last visit - She has a MedAlert bracelet mentioning "adrenal insufficiency"  C. Hypogonadotropic hypogonadism - pt has been on OCPs for a long time, even b'f her craniotomy   - continue Yaz - sees Dr Ronita Hipps  D. [place] insufficiency - she has low IGF1 - at last visits, we did discuss about the indication for [facility] tx and possible benefits and SEs. She decided to forego starting this for now especially since the CV benefits of replacement are not clearly defined and the fact that is a daily injectable med.   E. No signs of DI.  - no increased urination or thirst   2. Low bone mineral density - we reviewed latest DXA scan from 2016. Her Z scores were low, however, she increased exercise and has a normal vitamin D level - We'll repeat the another scan now.  3. Elevated  HbA1c - Reviewed previous levels: Minimally elevated - Will recheck today per her request - Discussed the need to eliminate sodas - Continue exercise  Today, we will check: - vitamin D - free t4 and free t3 - BMD - HbA1c  Needs refills.  Office Visit on 02/04/2017  Component Date Value Ref Range Status  . T3, Free 02/04/2017 4.2  2.3 - 4.2 pg/mL Final  . VITD 02/04/2017  55.96  30.00 - 100.00 ng/mL Final  . Hgb A1c MFr Bld 02/04/2017 5.7  4.6 - 6.5 % Final   Glycemic Control Guidelines for People with Diabetes:Non Diabetic:  <6%Goal of Therapy: <7%Additional Action Suggested:  >8%   . Free T4 02/04/2017 1.01  0.60 - 1.60 ng/dL Final   Comment: Specimens from patients who are undergoing biotin therapy and /or ingesting biotin supplements may contain high levels of biotin.  The higher biotin concentration in these specimens interferes with this Free T4 assay.  Specimens that contain high levels  of biotin may cause false high results for this Free T4 assay.  Please interpret results in light of the total clinical presentation of the patient.     All labs are good.   Lumbar spine L1-L4 Femoral neck (FN) 33% distal radius  Z-score -0.5 RFN: -1.9 LFN: -2.2 n/a  Change in BMD from previous DXA test (%) Down 0.9% Up 1.7% n/a  (*) statistically significant   Philemon Kingdom, MD PhD [facility] Endocrinology

## 2017-02-04 NOTE — Patient Instructions (Addendum)
Please stop at the lab.  Please continue current Levothyroxine and hydrocortisone dose.  Please start hydrocortisone 10 mg in a.m. and 5 mg in p.m. (no later than 6 PM).  - You absolutely need to take this medication every day and not skip doses. - Please double the dose if you have a fever, for the duration of the fever. - If you cannot take anything by mouth (vomiting) or you have severe diarrhea so that you eliminate the hydrocortisone pills in your stool, please make sure that you get the hydrocortisone in the vein instead - go to the nearest emergency department/urgent care or you may go to your PCPs office  - Please try to get a MedAlert bracelet or pendant indicating: "Adrenal insufficiency".  Please return in 1 year.

## 2017-02-04 NOTE — Telephone Encounter (Signed)
Patient advised that her BMD Scan is scheduled at [facility] for 11.12.18 @ 9:30am/thx dmf

## 2017-02-07 MED ORDER — LEVOTHYROXINE SODIUM 88 MCG PO TABS: 88.0000 ug | ORAL_TABLET | Freq: Every day | ORAL | 3 refills | Status: DC

## 2017-02-14 ENCOUNTER — Ambulatory Visit (INDEPENDENT_AMBULATORY_CARE_PROVIDER_SITE_OTHER)
Admission: RE | Admit: 2017-02-14 | Discharge: 2017-02-14 | Disposition: A | Discharge status: Home / Self Care | Payer: 59 | Source: Ambulatory Visit | Attending: Internal Medicine | Admitting: Internal Medicine

## 2017-02-14 DIAGNOSIS — M85852 Other specified disorders of bone density and structure, left thigh: Secondary | ICD-10-CM | POA: Diagnosis not present

## 2017-02-14 DIAGNOSIS — M859 Disorder of bone density and structure, unspecified: Secondary | ICD-10-CM

## 2017-02-14 DIAGNOSIS — M8580 Other specified disorders of bone density and structure, unspecified site: Secondary | ICD-10-CM

## 2017-02-15 ENCOUNTER — Other Ambulatory Visit: Payer: Self-pay | Admitting: Internal Medicine

## 2017-02-15 DIAGNOSIS — M85852 Other specified disorders of bone density and structure, left thigh: Secondary | ICD-10-CM | POA: Insufficient documentation

## 2017-02-22 MED FILL — HYDROCORTISONE 5 MG TABLET: 5 | 90 days supply | Qty: 270 | Fill #0

## 2017-02-22 MED FILL — GIANVI 3-0.02 MG TABS: 3-0.02 | 84 days supply | Qty: 112 | Fill #1

## 2017-02-22 MED FILL — LEVOTHYROXINE 88 MCG TABLET: 88 | 90 days supply | Qty: 90 | Fill #2

## 2017-05-13 DIAGNOSIS — E893 Postprocedural hypopituitarism: Secondary | ICD-10-CM | POA: Diagnosis not present

## 2017-05-13 DIAGNOSIS — Z0000 Encounter for general adult medical examination without abnormal findings: Secondary | ICD-10-CM | POA: Diagnosis not present

## 2017-05-17 DIAGNOSIS — H40023 Open angle with borderline findings, high risk, bilateral: Secondary | ICD-10-CM | POA: Diagnosis not present

## 2017-05-17 DIAGNOSIS — D352 Benign neoplasm of pituitary gland: Secondary | ICD-10-CM | POA: Diagnosis not present

## 2017-06-01 MED FILL — GIANVI 3-0.02 MG TABS: 3-0.02 | 84 days supply | Qty: 112 | Fill #2

## 2017-06-01 MED FILL — LEVOTHYROXINE 88 MCG TABLET: 88 | 90 days supply | Qty: 90 | Fill #0

## 2017-06-01 MED FILL — HYDROCORTISONE 5 MG TABLET: 5 | 90 days supply | Qty: 270 | Fill #1

## 2017-08-31 ENCOUNTER — Other Ambulatory Visit: Payer: Self-pay | Admitting: Internal Medicine

## 2017-08-31 MED FILL — GIANVI 3-0.02 MG TABS: 3-0.02 | 84 days supply | Qty: 112 | Fill #3

## 2017-08-31 MED FILL — LEVOTHYROXINE 88 MCG TABLET: 88 | 90 days supply | Qty: 90 | Fill #1

## 2017-08-31 MED FILL — HYDROCORTISONE 5 MG TABLET: 5 | 90 days supply | Qty: 270 | Fill #0

## 2017-10-27 DIAGNOSIS — R350 Frequency of micturition: Secondary | ICD-10-CM | POA: Diagnosis not present

## 2017-10-27 MED FILL — SULFAMETHOXAZOLE-TMP DS TAB: 800-160 | 3 days supply | Qty: 6 | Fill #0

## 2017-11-15 DIAGNOSIS — H52223 Regular astigmatism, bilateral: Secondary | ICD-10-CM | POA: Diagnosis not present

## 2017-11-15 DIAGNOSIS — H5213 Myopia, bilateral: Secondary | ICD-10-CM | POA: Diagnosis not present

## 2017-11-22 ENCOUNTER — Other Ambulatory Visit: Payer: Self-pay | Admitting: Internal Medicine

## 2017-11-22 MED FILL — LEVOTHYROXINE 88 MCG TABLET: 88 | 90 days supply | Qty: 90 | Fill #2

## 2017-11-22 MED FILL — HYDROCORTISONE 5 MG TABLET: 5 | 90 days supply | Qty: 270 | Fill #0

## 2017-11-22 MED FILL — GIANVI 3-0.02 MG TABS: 3-0.02 | 84 days supply | Qty: 112 | Fill #4

## 2018-01-03 DIAGNOSIS — M79672 Pain in left foot: Secondary | ICD-10-CM | POA: Diagnosis not present

## 2018-01-03 DIAGNOSIS — M25572 Pain in left ankle and joints of left foot: Secondary | ICD-10-CM | POA: Diagnosis not present

## 2018-01-26 DIAGNOSIS — M79672 Pain in left foot: Secondary | ICD-10-CM | POA: Diagnosis not present

## 2018-01-26 DIAGNOSIS — M25572 Pain in left ankle and joints of left foot: Secondary | ICD-10-CM | POA: Diagnosis not present

## 2018-02-03 ENCOUNTER — Ambulatory Visit (INDEPENDENT_AMBULATORY_CARE_PROVIDER_SITE_OTHER): Payer: 59 | Admitting: Internal Medicine

## 2018-02-03 ENCOUNTER — Encounter: Payer: Self-pay | Admitting: Internal Medicine

## 2018-02-03 VITALS — BP 108/70 | HR 81 | Ht 63.0 in | Wt 122.0 lb

## 2018-02-03 DIAGNOSIS — M8580 Other specified disorders of bone density and structure, unspecified site: Secondary | ICD-10-CM

## 2018-02-03 DIAGNOSIS — M859 Disorder of bone density and structure, unspecified: Secondary | ICD-10-CM

## 2018-02-03 DIAGNOSIS — R7309 Other abnormal glucose: Secondary | ICD-10-CM | POA: Diagnosis not present

## 2018-02-03 DIAGNOSIS — E893 Postprocedural hypopituitarism: Secondary | ICD-10-CM

## 2018-02-03 DIAGNOSIS — E559 Vitamin D deficiency, unspecified: Secondary | ICD-10-CM

## 2018-02-03 NOTE — Patient Instructions (Addendum)
Please come back for labs in 3 days. Skip both Hydrocortisone and Levothyroxine that am.  Please continue current Levothyroxine and hydrocortisone dose.  Please continue hydrocortisone 10 mg in a.m. and 5 mg in p.m. (no later than 6 PM).  - You absolutely need to take this medication every day and not skip doses. - Please double the dose if you have a fever, for the duration of the fever. - If you cannot take anything by mouth (vomiting) or you have severe diarrhea so that you eliminate the hydrocortisone pills in your stool, please make sure that you get the hydrocortisone in the vein instead - go to the nearest emergency department/urgent care or you may go to your PCPs office  - Please try to get a MedAlert bracelet or pendant indicating: "Adrenal insufficiency".  Please return in 1 year.

## 2018-02-03 NOTE — Progress Notes (Signed)
Patient ID: [name], female   DOB: Jul 19, 1980, 37 y.o.   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 She was previously seen by Dr Marisa Galloway. Last visit with me one year ago.  Before last visit, she lost 11 pounds through increased exercise at [facility].  She lost 3 more pounds since last visit.  Now in boot for stress fx of L foot.  She has 2 more weeks until the boot comes off.  Reviewed history: Pt. has been dx with a nonsecreting pituitary adenoma in 04/2011 >> noticed a change in vision >> changed glasses >> vision still deteriorating >> VF abnormal >> MRI pituitary >> pituitary tumor >> 05/2011 TSR >> 09/2011 craniotomy by Dr Marisa Galloway as there was still remnant pituitary tumor in the sella. 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 She had VF checked after the surgery >> normalised.   10/10/2015: Eye and visual field exam: - Vision 20/20 both eyes  - IOP 15 both eyes  - Visual fields: Complete resolution of bitemporal hemianopsia!  Pituitary MRI (01/26/2016): No change from the prior study. Postop pituitary resection. No residual pituitary tissue identified. Infundibulum not identified. No significant abnormality in the remainder of the brain.  Central hypothyroidism:  Levothyroxine 75 >> 88 mcg daily: - in am - fasting - at least 30 min from b'fast - no Ca, Fe, MVI, PPIs - not on Biotin   Free T4 levels -normal: Lab Results  Component Value Date   FREET4 1.01 02/04/2017   FREET4 0.89 04/08/2016   FREET4 0.69 02/06/2016   FREET4 0.80 02/06/2015   FREET4 0.85 01/22/2014  Per records from Dr Marisa Galloway: 12/23/2011: free t4: 1.08   Central adrenal insufficiency  On hydrocortisone -10 mg in a.m. and 5 mg in p.m. -No complaints -Did not have to use a higher dose of hydrocortisone or parenteral steroid solution since last  visit   Hypogonadotropic Hypogonadism  On Yaz - NO current pregnancy plans  High Prolactin  Previously on bromocriptine, which was stopped in 2015  She had a low prolactin 04/2013, but previously high prolactin, likely secondary to stop compression: Lab Results  Component Value Date   PROLACTIN 18.2 02/06/2016   PROLACTIN 24.9 04/18/2015   PROLACTIN 20.9 02/06/2015   PROLACTIN 0.7 01/22/2014   PROLACTIN 41.6 05/17/2011  04/11/2013: Prolactin 0.7 12/20/2012: Prolactin <0.1 12/23/2011: Prolactin 42.5  GH deficiency  Not on growth hormone  07/03/2013: IGF-I 34 (73-243) 06/21/2012: IGF-I 34 (73-243) 12/23/2011 IGF-I 19 (77-271)  She also has a history of low BMD for age: 14/2018 Lumbar spine L1-L4 Femoral neck (FN)  Z-score -0.5 RFN: -1.9 LFN: -2.2  Change in BMD from previous DXA test (%) Down 0.9% Up 1.7%  (*) statistically significant   Lumbar spine (L1-L4)Z score Femoral neck (FN) Z score  02/18/2015 (Marisa Galloway) -0.3  RFN: -1.8 LFN: -2.3  06/21/2012  -0.7 RFN: -2.1 LFN: -2.0   She continues on calcium carbonate + vitamin D (600 mg + 400 units) 1x a day + 1000 units vitamin D daily.  Vitamin D levels have been normal. Lab Results  Component Value Date   VD25OH 55.96 02/04/2017   VD25OH 59.36 02/06/2016   VD25OH 57.41 04/18/2015   She continues to exercise regularly (stopped after the stress fracture, though).  HbA1c levels were in the low prediabetic range: Lab Results  Component Value Date  HGBA1C 5.7 02/04/2017   HGBA1C 5.7 02/06/2016   HGBA1C 5.8 02/06/2015   She continues to drink one soda a day, for the caffeine, and she does not drink coffee and tea.  ROS: Constitutional: no weight gain/no weight loss, no fatigue, no subjective hyperthermia, no subjective hypothermia Eyes: no blurry vision, no xerophthalmia ENT: no sore throat, no nodules palpated in neck, no dysphagia, no odynophagia, no hoarseness Cardiovascular: no CP/no SOB/no  palpitations/no leg swelling Respiratory: no cough/no SOB/no wheezing Gastrointestinal: no N/no V/no D/no C/no acid reflux Musculoskeletal: no muscle aches/no joint aches, + pain in left foot Skin: no rashes, no hair loss Neurological: no tremors/no numbness/no tingling/no dizziness  I reviewed pt's medications, allergies, PMH, social hx, family hx, and changes were documented in the history of present illness. Otherwise, unchanged from my initial visit note.  Past Medical History:  Diagnosis Date  . Headache(784.0)    occasionally  . Hypopituitarism after adenoma resection (Marisa Galloway) 01/21/2014  . Pituitary tumor   . Seasonal allergies    doesn't require meds  . Vision changes    bilateral hemianopsia   Past Surgical History:  Procedure Laterality Date  . BRAIN SURGERY    . CRANIOTOMY  05/18/2011   Procedure: CRANIOTOMY HYPOPHYSECTOMY TRANSNASAL APPROACH;  Surgeon: Marisa Spangle, MD;  Location: [facility];  Service: Neurosurgery;  Laterality: N/A;  Transphenoidal resection of pituitary tumor with Dr Marisa Galloway Fat graft from abdomen  . CRANIOTOMY  09/29/2011   Procedure: CRANIOTOMY TUMOR EXCISION;  Surgeon: Marisa Spangle, MD;  Location: [facility];  Service: Neurosurgery;  Laterality: N/A;  Craniotomy for resection of pituitary tumor  . NASAL SINUS SURGERY  05/18/2011   Procedure: ENDOSCOPIC SINUS SURGERY WITH STEALTH;  Surgeon: Marisa Bern, MD;  Location: [facility];  Service: ENT;  Laterality: N/A;  Opening for Transphenoidal for pituitary tumor  . SALIVARY GLAND SURGERY  08/09/2007   History   Social History  . Marital Status: Single    Spouse Name: N/A    Number of Children: 0   Occupational History  . RN - [employer]   Social History Main Topics  . Smoking status: Never Smoker   . Smokeless tobacco: Not on file  . Alcohol Use: Yes     Comment: rare glass of wine  . Drug Use: No  . Sexual Activity: Yes    Birth Control/ Protection: Pill   Current  Outpatient Medications on File Prior to Visit  Medication Sig Dispense Refill  . Calcium Carb-Cholecalciferol (CALCIUM-VITAMIN D) 600-400 MG-UNIT TABS Take 1 tablet by mouth 2 (two) times daily.    . Cholecalciferol (VITAMIN D PO) Take 1,000 Units by mouth.    . drospirenone-ethinyl estradiol (YAZ,GIANVI,LORYNA) 3-0.02 MG tablet Take 1 tablet by mouth daily.    . hydrocortisone (CORTEF) 5 MG tablet TAKE 2 TABLETS BY MOUTH EVERY MORNING AND 1 TABLET IN THE EVENING 270 tablet 1  . hydrocortisone (CORTEF) 5 MG tablet TAKE 2 TABLETS BY MOUTH EVERY MORNING AND 1 TABLET IN THE EVENING 150 tablet 3  . hydrocortisone sodium succinate (SOLU-CORTEF) 100 MG SOLR injection Please inject in the muscle if you cannot take the hydrocortisone medication by mouth. 2 each prn  . levothyroxine (SYNTHROID, LEVOTHROID) 88 MCG tablet Take 1 tablet (88 mcg total) daily by mouth. 90 tablet 3  . Multiple Vitamin (MULTIVITAMIN) tablet Take 1 tablet by mouth daily.    . naproxen sodium (ANAPROX) 220 MG tablet Take 220 mg by mouth as needed.  For pain.    . Syringe/Needle, Disp, (SYRINGE 3CC/25GX1") 25G X 1" 3 ML MISC Inject in the muscle as needed - use for solucortef 50 each prn   No current facility-administered medications on file prior to visit.     No Known Allergies   No family history on file.  PE: BP 108/70   Pulse 81   Ht 5\' 3"  (1.6 m) Comment: measured  Wt 122 lb (55.3 kg)   SpO2 98%   BMI 21.61 kg/m  Body mass index is 21.61 kg/m. Wt Readings from Last 3 Encounters:  02/03/18 122 lb (55.3 kg)  02/04/17 125 lb 9.6 oz (57 kg)  02/06/16 136 lb (61.7 kg)   Constitutional: Normal weight, in NAD Eyes: PERRLA, EOMI, no exophthalmos ENT: moist mucous membranes, no thyromegaly, no cervical lymphadenopathy Cardiovascular: RRR, No MRG Respiratory: CTA B Gastrointestinal: abdomen soft, NT, ND, BS+ Musculoskeletal: no deformities, strength intact in all 4: Left foot in boot Skin: moist, warm, no  rashes Neurological: no tremor with outstretched hands, DTR normal in all 4  ASSESSMENT: 1. Hypopituitarism - Visual field normalized: VF from 10/10/2015: Complete resolution of bitemporal hemianopsia!  2. Low bone mineral density for age  PLAN:  1. Hypopituitarism -Most recent MRI from 2017 showed no recurrence of pituitary tumor.  We discussed about repeating her MRI next year.  She has another visual field scheduled for 04/2018.  A. Central hypothyroidism - latest thyroid labs reviewed with pt >> normal  - she continues on LT4 88 mcg daily - pt feels good on this dose. - we discussed about taking the thyroid hormone every day, with water, >30 minutes before breakfast, separated by >4 hours from acid reflux medications, calcium, iron, multivitamins. Pt. is taking it correctly. - will check thyroid tests today: Free T4 and free T3, as her TSH is not interpretable due to central, rather than primary hypothyroidism - If labs are abnormal, she will need to return for repeat TFTs in 1.5 months  B. Central adrenal insufficiency -Continues on hydrocortisone 10 mg in a.m. and 5 mg in p.m. -No complaints, no significant fatigue -She is aware of sick day rules:  If you cannot keep anything down, including your hydrocortisone medication, please go to the emergency room or your primary care physician office to get steroids injected in the muscle or vein.  If you have a fever (more than 100 Fahrenheit), please double the dose of your hydrocortisone for the duration of the fever.  Do not run out of your hydrocortisone medication. -She did not have to use IM or IV steroids her to double the dose of her hydrocortisone since last visit -She has a med alert bracelet mentioning "adrenal insufficiency"  C. Hypogonadotropic hypogonadism -She has been on OCPs for a long time, before her craniotomy -Continues OCPs (Yaz) -Sees Dr. Ronita Hipps -No current plans for pregnancy  D. GH insufficiency -She  does have low IGF-I -At last visit, we did discuss about the indication for growth hormone treatment and possible benefits and side effects.  She decided to forego starting this for now especially since the cardiovascular benefits of replacement are not clearly defined and the fact that it is a daily injectable medicine.  E. No signs of DI.  -No increased thirst or urination   2. Low bone mineral density -Reviewed the latest DXA scan from a year ago: Stable -We will check a vitamin D level today -Continue exercise  3. Elevated HbA1c -Reviewed previous levels: Minimally elevated: Lab Results  Component Value Date   HGBA1C 5.7 02/04/2017  -We can recheck this today -We again discussed the need to eliminate sodas - she is working on this -Continue exercise.  Needs LT4, HC refills.   No visits with results within 1 Day(s) from this visit.  Latest known visit with results is:  Office Visit on 02/04/2017  Component Date Value Ref Range Status  . T3, Free 02/04/2017 4.2  2.3 - 4.2 pg/mL Final  . VITD 02/04/2017 55.96  30.00 - 100.00 ng/mL Final  . Hgb A1c MFr Bld 02/04/2017 5.7  4.6 - 6.5 % Final   Glycemic Control Guidelines for People with Diabetes:Non Diabetic:  <6%Goal of Therapy: <7%Additional Action Suggested:  >8%   . Free T4 02/04/2017 1.01  0.60 - 1.60 ng/dL Final   Comment: Specimens from patients who are undergoing biotin therapy and /or ingesting biotin supplements may contain high levels of biotin.  The higher biotin concentration in these specimens interferes with this Free T4 assay.  Specimens that contain high levels  of biotin may cause false high results for this Free T4 assay.  Please interpret results in light of the total clinical presentation of the patient.     Message sent: Dear Ms. Marisa Galloway, Labs are all normal. HbA1c slightly higher, but it still at the lower end of the prediabetic range, where the levels usually fluctuate due to assay  variability. Sincerely, Philemon Kingdom MD  Philemon Kingdom, MD PhD [facility]

## 2018-02-06 ENCOUNTER — Other Ambulatory Visit (INDEPENDENT_AMBULATORY_CARE_PROVIDER_SITE_OTHER): Payer: 59

## 2018-02-06 DIAGNOSIS — R7309 Other abnormal glucose: Secondary | ICD-10-CM

## 2018-02-06 DIAGNOSIS — E893 Postprocedural hypopituitarism: Secondary | ICD-10-CM | POA: Diagnosis not present

## 2018-02-06 DIAGNOSIS — M8580 Other specified disorders of bone density and structure, unspecified site: Secondary | ICD-10-CM | POA: Diagnosis not present

## 2018-02-06 DIAGNOSIS — M859 Disorder of bone density and structure, unspecified: Secondary | ICD-10-CM

## 2018-02-06 DIAGNOSIS — E559 Vitamin D deficiency, unspecified: Secondary | ICD-10-CM | POA: Diagnosis not present

## 2018-02-06 LAB — VITAMIN D 25 HYDROXY (VIT D DEFICIENCY, FRACTURES): VITD: 53.53 ng/mL (ref 30.00–100.00)

## 2018-02-06 LAB — T4, FREE: Free T4: 0.79 ng/dL (ref 0.60–1.60)

## 2018-02-06 LAB — T3, FREE: T3 FREE: 3.2 pg/mL (ref 2.3–4.2)

## 2018-02-06 LAB — HEMOGLOBIN A1C: HEMOGLOBIN A1C: 5.8 % (ref 4.6–6.5)

## 2018-02-07 LAB — PROLACTIN: Prolactin: 21.8 ng/mL

## 2018-02-07 MED ORDER — LEVOTHYROXINE SODIUM 88 MCG PO TABS: 88.0000 ug | ORAL_TABLET | Freq: Every day | ORAL | 3 refills | Status: DC

## 2018-02-07 MED ORDER — HYDROCORTISONE 5 MG PO TABS: ORAL_TABLET | ORAL | 3 refills | Status: DC

## 2018-03-01 MED FILL — LEVOTHYROXINE 88 MCG TABLET: 88 | 90 days supply | Qty: 90 | Fill #0

## 2018-03-31 MED FILL — LEVOTHYROXINE 88 MCG TABLET: 88 | 90 days supply | Qty: 90 | Fill #0

## 2018-04-27 DIAGNOSIS — Z6822 Body mass index (BMI) 22.0-22.9, adult: Secondary | ICD-10-CM | POA: Diagnosis not present

## 2018-04-27 DIAGNOSIS — Z01419 Encounter for gynecological examination (general) (routine) without abnormal findings: Secondary | ICD-10-CM | POA: Diagnosis not present

## 2018-05-11 ENCOUNTER — Telehealth: Payer: 59 | Admitting: Physician Assistant

## 2018-05-11 DIAGNOSIS — M5430 Sciatica, unspecified side: Secondary | ICD-10-CM

## 2018-05-11 NOTE — Progress Notes (Signed)
Based on what you shared with me it looks like you have a condition that should be evaluated in a face to face office visit.  I do agree that symptoms seem consistent with sciatica. If symptoms are not improving with Naproxen I would recommend you be seen in person for assessment and a steroid shot to calm this down.   NOTE: If you entered your credit card information for this eVisit, you will not be charged. You may see a "hold" on your card for the $30 but that hold will drop off and you will not have a charge processed.  If you are having a true medical emergency please call 911.  If you need an urgent face to face visit, [facility] has four urgent care centers for your convenience.  If you need care fast and have a high deductible or no insurance consider:   [email or web address] to reserve your spot online an avoid wait times  [facility] [address], [address] [place], [place] [address] 8 am to 8 pm Monday-Friday 10 am to 4 pm Saturday-Sunday *Across the street from [employer]  [place], [address] 8 am to 5 pm Monday-Friday * In the [facility] on the [facility]   The following sites will take your  insurance:  . [facility] [place] a Provider at this Location  [address] [place], [place] [address] . 10 am to 8 pm Monday-Friday . 12 pm to 8 pm Saturday-Sunday   . [facility] [place] a Provider at this Location  [address], [place], [place] [address] . 8 am to 8 pm Monday-Friday . 9 am to 6 pm Saturday . 11 am to 6 pm Sunday   . [facility] at [place] Get Driving Directions  [address] [place],  [address] . 8 am to 8 pm Monday-Friday . 8 am to 4 pm Saturday-Sunday   Your e-visit answers  were reviewed by a board certified advanced clinical practitioner to complete your personal care plan.  Thank you for using e-Visits.

## 2018-06-06 DIAGNOSIS — D352 Benign neoplasm of pituitary gland: Secondary | ICD-10-CM | POA: Diagnosis not present

## 2018-06-06 DIAGNOSIS — H40023 Open angle with borderline findings, high risk, bilateral: Secondary | ICD-10-CM | POA: Diagnosis not present

## 2018-06-26 MED FILL — LEVOTHYROXINE 88 MCG TABLET: 88 | 90 days supply | Qty: 90 | Fill #1

## 2018-06-28 MED FILL — HYDROCORTISONE 10 MG TABLET: 10 | 90 days supply | Qty: 135 | Fill #0

## 2018-07-07 ENCOUNTER — Telehealth: Payer: Self-pay | Admitting: Internal Medicine

## 2018-07-07 NOTE — Telephone Encounter (Signed)
error 

## 2018-07-11 ENCOUNTER — Telehealth: Payer: Self-pay | Admitting: Internal Medicine

## 2018-07-11 NOTE — Telephone Encounter (Signed)
Notified patient that form is ready to pick up.

## 2018-07-11 NOTE — Telephone Encounter (Signed)
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 She is getting a status.  Please Advise, Thanks

## 2018-08-08 DIAGNOSIS — K760 Fatty (change of) liver, not elsewhere classified: Secondary | ICD-10-CM | POA: Diagnosis not present

## 2018-08-08 DIAGNOSIS — Z0000 Encounter for general adult medical examination without abnormal findings: Secondary | ICD-10-CM | POA: Diagnosis not present

## 2018-08-08 DIAGNOSIS — E893 Postprocedural hypopituitarism: Secondary | ICD-10-CM | POA: Diagnosis not present

## 2018-09-25 MED FILL — LEVOTHYROXINE 88 MCG TABLET: 88 | 90 days supply | Qty: 90 | Fill #2

## 2018-12-23 MED FILL — LEVOTHYROXINE 88 MCG TABLET: 88 | 90 days supply | Qty: 90 | Fill #3

## 2019-01-26 ENCOUNTER — Encounter: Payer: Self-pay | Admitting: Internal Medicine

## 2019-02-02 ENCOUNTER — Other Ambulatory Visit: Payer: Self-pay

## 2019-02-06 ENCOUNTER — Ambulatory Visit (INDEPENDENT_AMBULATORY_CARE_PROVIDER_SITE_OTHER): Payer: 59 | Admitting: Internal Medicine

## 2019-02-06 ENCOUNTER — Encounter: Payer: Self-pay | Admitting: Internal Medicine

## 2019-02-06 ENCOUNTER — Other Ambulatory Visit: Payer: Self-pay

## 2019-02-06 VITALS — BP 100/62 | HR 78 | Ht 63.25 in | Wt 124.0 lb

## 2019-02-06 DIAGNOSIS — M8580 Other specified disorders of bone density and structure, unspecified site: Secondary | ICD-10-CM | POA: Diagnosis not present

## 2019-02-06 DIAGNOSIS — M859 Disorder of bone density and structure, unspecified: Secondary | ICD-10-CM

## 2019-02-06 DIAGNOSIS — M85852 Other specified disorders of bone density and structure, left thigh: Secondary | ICD-10-CM | POA: Diagnosis not present

## 2019-02-06 DIAGNOSIS — R7303 Prediabetes: Secondary | ICD-10-CM

## 2019-02-06 DIAGNOSIS — E893 Postprocedural hypopituitarism: Secondary | ICD-10-CM | POA: Diagnosis not present

## 2019-02-06 LAB — T4, FREE: Free T4: 0.84 ng/dL (ref 0.60–1.60)

## 2019-02-06 LAB — VITAMIN D 25 HYDROXY (VIT D DEFICIENCY, FRACTURES): VITD: 37.37 ng/mL (ref 30.00–100.00)

## 2019-02-06 LAB — T3, FREE: T3, Free: 3.6 pg/mL (ref 2.3–4.2)

## 2019-02-06 NOTE — Patient Instructions (Signed)
Please stop at the lab.  Please continue levothyroxine 88 mcg daily.  Take the thyroid hormone every day, with water, at least 30 minutes before breakfast, separated by at least 4 hours from: - acid reflux medications - calcium - iron - multivitamins  Please continue hydrocortisone 10 mg in a.m. and 5 mg in p.m. (no later than 6 PM).  - You absolutely need to take this medication every day and not skip doses. - Please double the dose if you have a fever, for the duration of the fever. - If you cannot take anything by mouth (vomiting) or you have severe diarrhea so that you eliminate the hydrocortisone pills in your stool, please make sure that you get the hydrocortisone in the vein instead - go to the nearest emergency department/urgent care or you may go to your PCPs office  - Please try to get a MedAlert bracelet or pendant indicating: "Adrenal insufficiency".  Please return in 1 year.

## 2019-02-06 NOTE — Progress Notes (Signed)
Patient ID: Marisa Galloway, female   DOB: 16-Oct-1980, 38 y.o.   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 She was previously seen by Dr Chalmers Cater. Last visit with me 1 year ago.  Reviewed history: Pt. 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 She had VF checked after the surgery >> normalised.   10/10/2015: Eye and visual field exam: - Vision 20/20 both eyes  - IOP 15 both eyes  - Visual fields: Complete resolution of bitemporal hemianopsia!  Pituitary MRI (01/26/2016): No change from the prior study. Postop pituitary resection. No residual pituitary tissue identified. Infundibulum not identified. No significant abnormality in the remainder of the brain.  04/2018: VF normal 04/2019: VF pending  Central hypothyroidism:  Levothyroxine 88 mcg daily - in am - fasting - at least 30 min from b'fast - no Ca, Fe, MVI, PPIs - not on Biotin  Reviewed thyroid tests: Lab Results  Component Value Date   FREET4 0.79 02/06/2018   FREET4 1.01 02/04/2017   FREET4 0.89 04/08/2016   FREET4 0.69 02/06/2016   FREET4 0.80 02/06/2015   FREET4 0.85 01/22/2014  Per records from Dr Chalmers Cater: 12/23/2011: free t4: 1.08 Lab Results  Component Value Date   T3FREE 3.2 02/06/2018   T3FREE 4.2 02/04/2017   T3FREE 3.0 04/08/2016   T3FREE 4.0 02/06/2016     Central adrenal insufficiency  On hydrocortisone - on 10 mg in a.m. and 5 mg in p.m. -No fatigue, abdominal pain, nausea, vomiting, headaches -Did not have to use a higher dose of hydrocortisone on parenteral  steroid solution since last visit  Hypogonadotropic Hypogonadism  On Yaz >> Gianvi -No current pregnancy plans  High Prolactin  Previously on bromocriptine, which was started 2015  She had an elevated prolactin in the past most likely due to pituitary stalk compression.  Recent levels are normal: Lab Results  Component Value Date   PROLACTIN 21.8 02/06/2018   PROLACTIN 18.2 02/06/2016   PROLACTIN 24.9 04/18/2015   PROLACTIN 20.9 02/06/2015   PROLACTIN 0.7 01/22/2014   PROLACTIN 41.6 05/17/2011  04/11/2013: Prolactin 0.7 12/20/2012: Prolactin <0.1 12/23/2011: Prolactin 42.5  GH deficiency  She is not on growth hormone  07/03/2013: IGF-I 34 (73-243) 06/21/2012: IGF-I 34 (73-243) 12/23/2011 IGF-I 19 (77-271)  She also has a history of low BMD for age: 59/14/2018 Lumbar spine L1-L4 Femoral neck (FN)  Z-score -0.5 RFN: -1.9 LFN: -2.2  Change in BMD from previous DXA test (%) Down 0.9% Up 1.7%  (*) statistically significant   Lumbar spine (L1-L4)Z score Femoral neck (FN) Z score  02/18/2015 ([place]) -0.3  RFN: -1.8 LFN: -2.3  06/21/2012  -0.7 RFN: -2.1 LFN: -2.0   She continues on calcium carbonate + vitamin D (600 mg + 400 units) 1x a day +1000 units vitamin D daily  Vitamin D levels have been normal: Lab Results  Component Value Date   VD25OH 53.53 02/06/2018   VD25OH 55.96 02/04/2017   VD25OH 59.36 02/06/2016   VD25OH 57.41 04/18/2015   She continues to exercise regularly.  At last visit she had a stress  fracture but this healed and she was able to restart exercise.  She is now running 15 to 25 miles a week.  HbA1c is in the low prediabetic range Lab Results  Component Value Date   HGBA1C 5.8 02/06/2018   HGBA1C 5.7 02/04/2017   HGBA1C 5.7 02/06/2016   HGBA1C 5.8 02/06/2015   She does not drink coffee and tea and at last visit she was drinking 1 soda per day.  I did advise her to stop due to history of prediabetes. Now reduced the amounts but she  still drinks regular alternating with diet sodas.  ROS: Constitutional: no weight gain/no weight loss, no fatigue, no subjective hyperthermia, no subjective hypothermia Eyes: no blurry vision, no xerophthalmia ENT: no sore throat, no nodules palpated in neck, no dysphagia, no odynophagia, no hoarseness Cardiovascular: no CP/no SOB/no palpitations/no leg swelling Respiratory: no cough/no SOB/no wheezing Gastrointestinal: no N/no V/no D/no C/no acid reflux Musculoskeletal: no muscle aches/no joint aches Skin: no rashes, no hair loss Neurological: no tremors/no numbness/no tingling/no dizziness  I reviewed pt's medications, allergies, PMH, social hx, family hx, and changes were documented in the history of present illness. Otherwise, unchanged from my initial visit note..  Past Medical History:  Diagnosis Date  . Headache(784.0)    occasionally  . Hypopituitarism after adenoma resection ([place]) 01/21/2014  . Pituitary tumor   . Seasonal allergies    doesn't require meds  . Vision changes    bilateral hemianopsia   Past Surgical History:  Procedure Laterality Date  . BRAIN SURGERY    . CRANIOTOMY  05/18/2011   Procedure: CRANIOTOMY HYPOPHYSECTOMY TRANSNASAL APPROACH;  Surgeon: Hosie Spangle, MD;  Location: [facility];  Service: Neurosurgery;  Laterality: N/A;  Transphenoidal resection of pituitary tumor with Dr Wilburn Cornelia Fat graft from abdomen  . CRANIOTOMY  09/29/2011   Procedure: CRANIOTOMY TUMOR EXCISION;  Surgeon: Hosie Spangle, MD;  Location: [facility];  Service: Neurosurgery;  Laterality: N/A;  Craniotomy for resection of pituitary tumor  . NASAL SINUS SURGERY  05/18/2011   Procedure: ENDOSCOPIC SINUS SURGERY WITH STEALTH;  Surgeon: Delsa Bern, MD;  Location: [facility];  Service: ENT;  Laterality: N/A;  Opening for Transphenoidal for pituitary tumor  . SALIVARY GLAND SURGERY  08/09/2007   History   Social History  . Marital Status: Single    Spouse Name:  N/A    Number of Children: 0   Occupational History  . RN - [employer]   Social History Main Topics  . Smoking status: Never Smoker   . Smokeless tobacco: Not on file  . Alcohol Use: Yes     Comment: rare glass of wine  . Drug Use: No  . Sexual Activity: Yes    Birth Control/ Protection: Pill   Current Outpatient Medications on File Prior to Visit  Medication Sig Dispense Refill  . Calcium Carb-Cholecalciferol (CALCIUM-VITAMIN D) 600-400 MG-UNIT TABS Take 1 tablet by mouth 2 (two) times daily.    . Cholecalciferol (VITAMIN D PO) Take 1,000 Units by mouth.    . drospirenone-ethinyl estradiol (YAZ,GIANVI,LORYNA) 3-0.02 MG tablet Take 1 tablet by mouth daily.    . hydrocortisone (CORTEF) 5 MG tablet TAKE 2 TABLETS BY MOUTH EVERY MORNING AND 1 TABLET IN THE EVENING 300 tablet 3  . hydrocortisone sodium succinate (SOLU-CORTEF) 100 MG SOLR injection Please inject in the muscle if you cannot take the hydrocortisone medication by mouth. (Patient not taking: Reported on 02/03/2018) 2 each prn  . levothyroxine (SYNTHROID,  LEVOTHROID) 88 MCG tablet Take 1 tablet (88 mcg total) by mouth daily. 90 tablet 3  . Multiple Vitamin (MULTIVITAMIN) tablet Take 1 tablet by mouth daily.    . naproxen sodium (ANAPROX) 220 MG tablet Take 220 mg by mouth as needed. For pain.    . Syringe/Needle, Disp, (SYRINGE 3CC/25GX1") 25G X 1" 3 ML MISC Inject in the muscle as needed - use for solucortef 50 each prn   No current facility-administered medications on file prior to visit.     No Known Allergies   No family history on file.  PE: BP 100/62   Pulse 78   Ht 5' 3.25" (1.607 m) Comment: measured today without shoes  Wt 124 lb (56.2 kg)   SpO2 97%   BMI 21.79 kg/m  Body mass index is 21.79 kg/m. Wt Readings from Last 3 Encounters:  02/06/19 124 lb (56.2 kg)  02/03/18 122 lb (55.3 kg)  02/04/17 125 lb 9.6 oz (57 kg)   Constitutional: Normal weight, in NAD Eyes: PERRLA, EOMI, no exophthalmos ENT:  moist mucous membranes, no thyromegaly, no cervical lymphadenopathy Cardiovascular: RRR, No MRG Respiratory: CTA B Gastrointestinal: abdomen soft, NT, ND, BS+ Musculoskeletal: no deformities, strength intact in all 4 Skin: moist, warm, no rashes Neurological: no tremor with outstretched hands, DTR normal in all 4  ASSESSMENT: 1. Hypopituitarism - Visual field normalized: VF from 10/10/2015: Complete resolution of bitemporal hemianopsia!  2. Low bone mineral density for age  90. Prediabetes  PLAN:  1. Hypopituitarism -Most recent MRI from 2017 showed no recurrence of her pituitary tumor.  We will check another MRI this year.  She had another visual field 04/2018  A. Central hypothyroidism - latest thyroid labs reviewed with pt >> normal last year.   - she continues on LT4 88 mcg daily - pt feels good on this dose. - we discussed about taking the thyroid hormone every day, with water, >30 minutes before breakfast, separated by >4 hours from acid reflux medications, calcium, iron, multivitamins. Pt. is taking it correctly. - will check thyroid tests today: Free T3 and fT4.  Of note, TSH is not interpretable for her since she has central, rather primary hypothyroidism - If labs are abnormal, she will need to return for repeat TFTs in 1.5 months  B. Central adrenal insufficiency -She continues on hydrocortisone 10 mg in a.m. and 5 mg in p.m. -No complaints, no significant fatigue -She is aware of sick day rules but did not have to apply them since last visit  If you cannot keep anything down, including your hydrocortisone medication, please go to the emergency room or your primary care physician office to get steroids injected in the muscle or vein.  If you have a fever (more than 100 Fahrenheit), please double the dose of your hydrocortisone for the duration of the fever.  Do not run out of your hydrocortisone medication. -She does have a med alert bracelet mentioning - adrenal  insufficiency  C. Hypogonadotropic hypogonadism -She has been on OCPs for a long time, even before her craniotomy -Continues OCPs -Sees Dr. Ronita Hipps -No immediate plans for pregnancy  D. GH insufficiency -She has a low IGF-I -At previous visits, we did discuss about the indication for growth hormone treatment and possible benefits and side effects.  She decided to forego starting this for now especially since the cardiovascular benefits of replacement are not clearly defined and the fact that it is a daily injectable medicine.  E. No signs of DI.  -No  increased thirst or urination   2. Low bone mineral density -We reviewed her latest DXA scan from 2 years ago: Stable -We will check another one this month -We will check a vitamin D today -Continue running  3.  Prediabetes -Mild, her HbA1c levels have been at the lower range: Lab Results  Component Value Date   HGBA1C 5.8 02/06/2018  -We will recheck this today -At last visit we discussed about eliminating sodas.  She reduce the amount but still drinking some.  She does not think she can reduce the morning -Continue running  Needs refills on HC po and sq and LT4.  Office Visit on 02/06/2019  Component Date Value Ref Range Status  . T3, Free 02/06/2019 3.6  2.3 - 4.2 pg/mL Final  . Free T4 02/06/2019 0.84  0.60 - 1.60 ng/dL Final   Comment: Specimens from patients who are undergoing biotin therapy and /or ingesting biotin supplements may contain high levels of biotin.  The higher biotin concentration in these specimens interferes with this Free T4 assay.  Specimens that contain high levels  of biotin may cause false high results for this Free T4 assay.  Please interpret results in light of the total clinical presentation of the patient.    . Hgb A1c MFr Bld 02/06/2019 5.7  4.6 - 6.5 % Final   Glycemic Control Guidelines for People with Diabetes:Non Diabetic:  <6%Goal of Therapy: <7%Additional Action Suggested:  >8%   . VITD  02/06/2019 37.37  30.00 - 100.00 ng/mL Final   Labs are normal.  Vitamin D level is lower, but still normal.  I will addend the results of the pituitary MRI when they become available.  Philemon Kingdom, MD PhD [facility]

## 2019-02-07 LAB — HEMOGLOBIN A1C: Hgb A1c MFr Bld: 5.7 % (ref 4.6–6.5)

## 2019-02-07 MED ORDER — LEVOTHYROXINE SODIUM 88 MCG PO TABS: 88.0000 ug | ORAL_TABLET | Freq: Every day | ORAL | 3 refills | Status: DC

## 2019-02-07 MED ORDER — HYDROCORTISONE NA SUCCINATE PF 100 MG IJ SOLR: INTRAMUSCULAR | 99 refills | Status: DC

## 2019-02-07 MED ORDER — HYDROCORTISONE 5 MG PO TABS: ORAL_TABLET | ORAL | 3 refills | Status: DC

## 2019-02-07 MED FILL — HYDROCORTISONE 5 MG TABLET: 5 | 30 days supply | Qty: 90 | Fill #0

## 2019-02-07 MED FILL — SOLU-CORTEF (PF) 100 MG VIA: 100 | 15 days supply | Qty: 2 | Fill #0

## 2019-02-27 DIAGNOSIS — H5213 Myopia, bilateral: Secondary | ICD-10-CM | POA: Diagnosis not present

## 2019-02-27 DIAGNOSIS — H52223 Regular astigmatism, bilateral: Secondary | ICD-10-CM | POA: Diagnosis not present

## 2019-03-13 MED FILL — LEVOTHYROXINE 88 MCG TABLET: 88 | 90 days supply | Qty: 90 | Fill #0

## 2019-03-13 MED FILL — HYDROCORTISONE 5 MG TABLET: 5 | 30 days supply | Qty: 90 | Fill #0

## 2019-03-13 MED FILL — SOLU-CORTEF (PF) 100 MG VIA: 100 | 15 days supply | Qty: 2 | Fill #0

## 2019-03-26 ENCOUNTER — Encounter: Payer: Self-pay | Admitting: Internal Medicine

## 2019-05-28 ENCOUNTER — Encounter: Payer: Self-pay | Admitting: Internal Medicine

## 2019-06-16 ENCOUNTER — Encounter: Payer: Self-pay | Admitting: Internal Medicine

## 2019-06-19 DIAGNOSIS — Z6821 Body mass index (BMI) 21.0-21.9, adult: Secondary | ICD-10-CM | POA: Diagnosis not present

## 2019-06-19 DIAGNOSIS — Z1151 Encounter for screening for human papillomavirus (HPV): Secondary | ICD-10-CM | POA: Diagnosis not present

## 2019-06-19 DIAGNOSIS — Z01419 Encounter for gynecological examination (general) (routine) without abnormal findings: Secondary | ICD-10-CM | POA: Diagnosis not present

## 2019-06-23 MED FILL — LEVOTHYROXINE 88 MCG TABLET: 88 | 90 days supply | Qty: 90 | Fill #1

## 2019-06-23 MED FILL — HYDROCORTISONE 5 MG TABS: 5 | 30 days supply | Qty: 90 | Fill #1

## 2019-08-09 DIAGNOSIS — K760 Fatty (change of) liver, not elsewhere classified: Secondary | ICD-10-CM | POA: Diagnosis not present

## 2019-08-09 DIAGNOSIS — E893 Postprocedural hypopituitarism: Secondary | ICD-10-CM | POA: Diagnosis not present

## 2019-08-09 DIAGNOSIS — Z13220 Encounter for screening for lipoid disorders: Secondary | ICD-10-CM | POA: Diagnosis not present

## 2019-08-09 DIAGNOSIS — Z0000 Encounter for general adult medical examination without abnormal findings: Secondary | ICD-10-CM | POA: Diagnosis not present

## 2019-08-28 DIAGNOSIS — D352 Benign neoplasm of pituitary gland: Secondary | ICD-10-CM | POA: Diagnosis not present

## 2019-08-28 DIAGNOSIS — H40013 Open angle with borderline findings, low risk, bilateral: Secondary | ICD-10-CM | POA: Diagnosis not present

## 2019-08-29 ENCOUNTER — Ambulatory Visit (INDEPENDENT_AMBULATORY_CARE_PROVIDER_SITE_OTHER)
Admission: RE | Admit: 2019-08-29 | Discharge: 2019-08-29 | Disposition: A | Discharge status: Home / Self Care | Payer: 59 | Source: Ambulatory Visit | Attending: Internal Medicine | Admitting: Internal Medicine

## 2019-08-29 ENCOUNTER — Other Ambulatory Visit: Payer: Self-pay

## 2019-08-29 DIAGNOSIS — M85852 Other specified disorders of bone density and structure, left thigh: Secondary | ICD-10-CM | POA: Diagnosis not present

## 2019-09-07 ENCOUNTER — Encounter (HOSPITAL_COMMUNITY): Payer: Self-pay

## 2019-09-09 ENCOUNTER — Encounter: Payer: Self-pay | Admitting: Internal Medicine

## 2019-09-10 ENCOUNTER — Ambulatory Visit (HOSPITAL_COMMUNITY): Payer: 59

## 2019-09-20 MED FILL — LEVOTHYROXINE 88 MCG TABLET: 88 | 90 days supply | Qty: 90 | Fill #2

## 2019-11-15 ENCOUNTER — Other Ambulatory Visit: Payer: Self-pay

## 2019-11-15 ENCOUNTER — Ambulatory Visit (HOSPITAL_COMMUNITY)
Admission: RE | Admit: 2019-11-15 | Discharge: 2019-11-15 | Disposition: A | Discharge status: Home / Self Care | Payer: 59 | Source: Ambulatory Visit | Attending: Internal Medicine | Admitting: Internal Medicine

## 2019-11-15 DIAGNOSIS — E893 Postprocedural hypopituitarism: Secondary | ICD-10-CM | POA: Insufficient documentation

## 2019-11-15 DIAGNOSIS — Z86018 Personal history of other benign neoplasm: Secondary | ICD-10-CM | POA: Diagnosis not present

## 2019-11-15 DIAGNOSIS — E236 Other disorders of pituitary gland: Secondary | ICD-10-CM | POA: Diagnosis not present

## 2019-11-15 DIAGNOSIS — J341 Cyst and mucocele of nose and nasal sinus: Secondary | ICD-10-CM | POA: Diagnosis not present

## 2019-11-15 MED ORDER — GADOBUTROL 1 MMOL/ML IV SOLN
5.0000 mL | Freq: Once | INTRAVENOUS | Status: AC | PRN
  Administered 2019-11-15: 5 mL via INTRAVENOUS

## 2019-12-12 MED FILL — LEVOTHYROXINE 88 MCG TABLET: 88 | 90 days supply | Qty: 90 | Fill #3

## 2019-12-12 MED FILL — HYDROCORTISONE 5 MG TABS: 5 | 30 days supply | Qty: 90 | Fill #2

## 2019-12-26 MED FILL — HYDROCORTISONE 5 MG TABS: 5 | 30 days supply | Qty: 90 | Fill #2

## 2019-12-26 MED FILL — LEVOTHYROXINE 88 MCG TABLET: 88 | 90 days supply | Qty: 90 | Fill #3

## 2020-01-04 ENCOUNTER — Encounter: Payer: Self-pay | Admitting: Internal Medicine

## 2020-02-07 ENCOUNTER — Ambulatory Visit: Payer: 59 | Admitting: Internal Medicine

## 2020-02-07 ENCOUNTER — Other Ambulatory Visit: Payer: Self-pay

## 2020-02-07 ENCOUNTER — Encounter: Payer: Self-pay | Admitting: Internal Medicine

## 2020-02-07 VITALS — BP 118/78 | HR 64 | Ht 63.0 in | Wt 123.4 lb

## 2020-02-07 DIAGNOSIS — R7303 Prediabetes: Secondary | ICD-10-CM | POA: Diagnosis not present

## 2020-02-07 DIAGNOSIS — E893 Postprocedural hypopituitarism: Secondary | ICD-10-CM | POA: Diagnosis not present

## 2020-02-07 DIAGNOSIS — M8580 Other specified disorders of bone density and structure, unspecified site: Secondary | ICD-10-CM | POA: Diagnosis not present

## 2020-02-07 DIAGNOSIS — E559 Vitamin D deficiency, unspecified: Secondary | ICD-10-CM

## 2020-02-07 DIAGNOSIS — M859 Disorder of bone density and structure, unspecified: Secondary | ICD-10-CM

## 2020-02-07 NOTE — Progress Notes (Addendum)
Patient ID: [name], female   DOB: May 06, 1980, 39 y.o.   MRN: [id number]  This visit occurred during the SARS-CoV-2 public health emergency.  Safety protocols were in place, including screening questions prior to the visit, additional usage of staff PPE, and extensive cleaning of exam room while observing appropriate contact time as indicated for disinfecting solutions.   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 She was previously seen by Dr Chalmers Cater. Last visit with me 1 year ago.  Reviewed and addended history: Pt. 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 She had VF checked after the surgery >> normalised.   10/10/2015: Eye and visual field exam: - Vision 20/20 both eyes  - IOP 15 both eyes  - Visual fields: Complete resolution of bitemporal hemianopsia!  Pituitary MRI (01/26/2016): No change from the prior study. Postop pituitary resection. No residual pituitary tissue identified. Infundibulum not identified. No significant abnormality in the remainder of the brain.  04/2018: VF normal 04/2019: VF normal reportedly  Pituitary MRI (11/15/2019): No residual pituitary tissue identified or other suspicious masses  Central hypothyroidism: - on Levothyroxine 88 mcg daily - in am - fasting - at least 30 min from b'fast - no calcium - no iron - + multivitamins at lunchtime - no PPIs - not on Biotin  Reviewed her thyroid tests: Lab Results  Component Value Date   FREET4 0.84 02/06/2019   FREET4 0.79 02/06/2018   FREET4 1.01 02/04/2017   FREET4 0.89  04/08/2016   FREET4 0.69 02/06/2016   FREET4 0.80 02/06/2015   FREET4 0.85 01/22/2014  Per records from Dr Chalmers Cater: 12/23/2011: free t4: 1.08 Lab Results  Component Value Date   T3FREE 3.6 02/06/2019   T3FREE 3.2 02/06/2018   T3FREE 4.2 02/04/2017   T3FREE 3.0 04/08/2016   T3FREE 4.0 02/06/2016   Central adrenal insufficiency  On hydrocortisone -10 mg in a.m. and 5 mg in p.m. -She denies fatigue, abdominal pain, nausea, vomiting, headaches -She did not have to use higher doses of cortisone or parenteral steroid solution since last visit  Hypogonadotropic Hypogonadism  On Yaz >> Gianvi -No current pregnancy plans  High Prolactin  Previously on bromocriptine, started in 2016  She had an elevated prolactin in the past, most likely due to pituitary stalk compression. Recent levels were normal: Lab Results  Component Value Date   PROLACTIN 21.8 02/06/2018   PROLACTIN 18.2 02/06/2016   PROLACTIN 24.9 04/18/2015   PROLACTIN 20.9 02/06/2015   PROLACTIN 0.7 01/22/2014   PROLACTIN 41.6 05/17/2011  04/11/2013: Prolactin 0.7 12/20/2012: Prolactin <0.1 12/23/2011: Prolactin 42.5  GH deficiency  She is not on growth hormone 07/03/2013: IGF-I 34 (73-243) 06/21/2012: IGF-I 34 (73-243) 12/23/2011 IGF-I 19 (77-271)  She also has low BMD for age: 02/29/2020 Lumbar spine L1-L4 Femoral neck (FN)  Z-score -0.2 RFN: -1.8 LFN: -2.2  Change in BMD from previous DXA test (%)  +3.2%* -0.7%  (*) statistically significant  02/16/2017 Lumbar spine L1-L4 Femoral neck (FN)  Z-score -0.5 RFN: -1.9 LFN: -2.2  Change in BMD from previous DXA test (%) Down 0.9% Up 1.7%  (*)  statistically significant   Lumbar spine (L1-L4)Z score Femoral neck (FN) Z score  02/18/2015 ([facility]) -0.3  RFN: -1.8 LFN: -2.3  06/21/2012  -0.7 RFN: -2.1 LFN: -2.0   She continues on calcium carbonate + vitamin D (600 mg +400 units) once a day +1000 units vitamin D daily.  Her vitamin D levels have been  normal: Lab Results  Component Value Date   VD25OH 37.37 02/06/2019   VD25OH 53.53 02/06/2018   VD25OH 55.96 02/04/2017   VD25OH 59.36 02/06/2016   VD25OH 57.41 04/18/2015   She continues to exercise regularly. She had a stress fracture in the past but this healed she was able to restart exercise. She was running between 15 to 25 mi a week, now took a break b/c long work hours.  HbA1c in the is in the low prediabetic range: Lab Results  Component Value Date   HGBA1C 5.7 02/06/2019   HGBA1C 5.8 02/06/2018   HGBA1C 5.7 02/04/2017   HGBA1C 5.7 02/06/2016   HGBA1C 5.8 02/06/2015   At previous visits, she was drinking more regular sodas as she did not drink coffee or tea. Afterwards, she reduce the amounts but still drinking some regular sodas alternating with diet sodas.  ROS: Constitutional: no weight gain/no weight loss, no fatigue, no subjective hyperthermia, no subjective hypothermia Eyes: no blurry vision, no xerophthalmia ENT: no sore throat, no nodules palpated in neck, no dysphagia, no odynophagia, no hoarseness Cardiovascular: no CP/no SOB/no palpitations/no leg swelling Respiratory: no cough/no SOB/no wheezing Gastrointestinal: no N/no V/no D/no C/no acid reflux Musculoskeletal: no muscle aches/no joint aches Skin: no rashes, no hair loss Neurological: no tremors/no numbness/no tingling/no dizziness  I reviewed pt's medications, allergies, PMH, social hx, family hx, and changes were documented in the history of present illness. Otherwise, unchanged from my initial visit note.  Past Medical History:  Diagnosis Date  . Headache(784.0)    occasionally  . Hypopituitarism after adenoma resection ([facility]) 01/21/2014  . Pituitary tumor   . Seasonal allergies    doesn't require meds  . Vision changes    bilateral hemianopsia   Past Surgical History:  Procedure Laterality Date  . BRAIN SURGERY    . CRANIOTOMY  05/18/2011   Procedure: CRANIOTOMY HYPOPHYSECTOMY TRANSNASAL  APPROACH;  Surgeon: Hosie Spangle, MD;  Location: [facility];  Service: Neurosurgery;  Laterality: N/A;  Transphenoidal resection of pituitary tumor with Dr Wilburn Cornelia Fat graft from abdomen  . CRANIOTOMY  09/29/2011   Procedure: CRANIOTOMY TUMOR EXCISION;  Surgeon: Hosie Spangle, MD;  Location: [place] [facility];  Service: Neurosurgery;  Laterality: N/A;  Craniotomy for resection of pituitary tumor  . NASAL SINUS SURGERY  05/18/2011   Procedure: ENDOSCOPIC SINUS SURGERY WITH STEALTH;  Surgeon: Delsa Bern, MD;  Location: [facility];  Service: ENT;  Laterality: N/A;  Opening for Transphenoidal for pituitary tumor  . SALIVARY GLAND SURGERY  08/09/2007   History   Social History  . Marital Status: Single    Spouse Name: N/A    Number of Children: 0   Occupational History  . RN - [employer]   Social History Main Topics  . Smoking status: Never Smoker   . Smokeless tobacco: Not on file  . Alcohol Use: Yes     Comment: rare glass of wine  . Drug Use: No  . Sexual Activity: Yes    Birth Control/ Protection: Pill   Current Outpatient Medications on File Prior to Visit  Medication Sig Dispense Refill  .  Calcium Carb-Cholecalciferol (CALCIUM-VITAMIN D) 600-400 MG-UNIT TABS Take 1 tablet by mouth daily.    . Cholecalciferol (VITAMIN D PO) Take 1,000 Units by mouth.    . drospirenone-ethinyl estradiol (YAZ,GIANVI,LORYNA) 3-0.02 MG tablet Take 1 tablet by mouth daily.    . hydrocortisone (CORTEF) 5 MG tablet TAKE 2 TABLETS BY MOUTH EVERY MORNING AND 1 TABLET IN THE EVENING 300 tablet 3  . hydrocortisone sodium succinate (SOLU-CORTEF) 100 MG SOLR injection Please inject in the muscle if you cannot take the hydrocortisone medication by mouth. 2 each prn  . levothyroxine (SYNTHROID) 88 MCG tablet Take 1 tablet (88 mcg total) by mouth daily. 90 tablet 3  . Multiple Vitamin (MULTIVITAMIN) tablet Take 1 tablet by mouth daily.    . Syringe/Needle, Disp, (SYRINGE 3CC/25GX1") 25G X 1" 3 ML  MISC Inject in the muscle as needed - use for solucortef 50 each prn   No current facility-administered medications on file prior to visit.    No Known Allergies   No family history on file.  PE: BP 118/78   Pulse 64   Ht 5\' 3"  (1.6 m)   Wt 123 lb 6.4 oz (56 kg)   BMI 21.86 kg/m  Body mass index is 21.86 kg/m. Wt Readings from Last 3 Encounters:  02/07/20 123 lb 6.4 oz (56 kg)  02/06/19 124 lb (56.2 kg)  02/03/18 122 lb (55.3 kg)   Constitutional: normal weight, in NAD Eyes: PERRLA, EOMI, no exophthalmos ENT: moist mucous membranes, no thyromegaly, no cervical lymphadenopathy Cardiovascular: RRR, No MRG Respiratory: CTA B Gastrointestinal: abdomen soft, NT, ND, BS+ Musculoskeletal: no deformities, strength intact in all 4 Skin: moist, warm, no rashes Neurological: no tremor with outstretched hands, DTR normal in all 4  ASSESSMENT: 1. Hypopituitarism - Visual field normalized: VF from 10/10/2015: Complete resolution of bitemporal hemianopsia!  2. Low bone mineral density for age  54. Prediabetes  PLAN:  1. Hypopituitarism -We reviewed together the most recent MRI from 11/15/2019: No recurrences of her previous pituitary tumor. She had another visual field in 04/2019.  I discussed with her today that no further visual fields are needed for her.  A. Central hypothyroidism - latest thyroid labs reviewed with pt >> normal  - she continues on LT4 88 mcg daily - pt feels good on this dose. - we discussed about taking the thyroid hormone every day, with water, >30 minutes before breakfast, separated by >4 hours from acid reflux medications, calcium, iron, multivitamins. Pt. is taking it correctly. - we will check a free T3 and free T4. For her, TSH is not interpretable since she has central, rather than primary hypothyroidism. - If labs are abnormal, she will need to return in 1.5 months for recheck  B. Central adrenal insufficiency -She continues on hydrocortisone 10 mg  in a.m. and 5 mg in p.m. -No complaints, no significant fatigue -She is aware of sick day rules, but did not have to apply them since last visit  If you cannot keep anything down, including your hydrocortisone medication, please go to the emergency room or your primary care physician office to get steroids injected in the muscle or vein.  If you have a fever (more than 100 Fahrenheit), please double the dose of your hydrocortisone for the duration of the fever.  Do not run out of your hydrocortisone medication. -She does have a med alert bracelet mentioning adrenal insufficiency  C. Hypogonadotropic hypogonadism -She has been on OCPs for a long time-before her craniotomy -She continues on  OCPs -Sees Dr. Ronita Hipps -No immediate plans for pregnancy  D. GH insufficiency -She continues to have a low IGF-I -At previous visits, we did discuss about the indication for growth hormone treatment and possible benefits/side effects. She decided to forego starting this, especially since the cardiovascular benefits of replacement are not clearly defined and the fact that it is a daily injectable medicine.  E. No signs of DI.  -She denies increased thirst and urination   2. Low bone mineral density -We reviewed the latest bone mineral density scan from 08/29/2019. Her Z score has increased in the spine and was stable in the hips. -She continues on vitamin D 1400 units daily and calcium 600 mg daily -We will recheck a vitamin D level at next lab draw-she will return for this -She is exercising but not as much as before due to being busy at work.  3.  Prediabetes -Mild, her HbA1c levels have been at the lower range of prediabetes: Lab Results  Component Value Date   HGBA1C 5.7 02/06/2019  -We will recheck this at next lab draw-she will return for this -At last visit and again today we discussed about stopping sodas. She already reduced the amount she drinks, but it is difficult for her not to drink  these at work.  She would like to eliminate them completely.  Orders Placed This Encounter  Procedures  . Hemoglobin A1c  . VITAMIN D 25 Hydroxy (Vit-D Deficiency, Fractures)  . T3, free  . T4, free   Needs refills HC and LT4.  Component     Latest Ref Rng & Units 02/14/2020  T4,Free(Direct)     0.60 - 1.60 ng/dL 0.84  Triiodothyronine,Free,Serum     2.3 - 4.2 pg/mL 3.2  Hemoglobin A1C     4.6 - 6.5 % 5.8  Vitamin D, 25-Hydroxy     30.0 - 100.0 ng/mL 19.9 (L)   Her free T4 is slightly lower than target.  We will go ahead and increase her levothyroxine dose to 100 mcg daily and recheck her tests in 1.5 to 2 months.  Also, vitamin D is quite low.  Will advise her to double up on her vitamin D supplement and recheck the test in 1.5 to 2 months. HbA1c is very slightly higher.  Philemon Kingdom, MD PhD [facility] Endocr[facility]

## 2020-02-07 NOTE — Patient Instructions (Addendum)
Please come back for labs.  Please continue levothyroxine 88 mcg daily.  Take the thyroid hormone every day, with water, at least 30 minutes before breakfast, separated by at least 4 hours from: - acid reflux medications - calcium - iron - multivitamins  Please continue hydrocortisone 10 mg in a.m. and 5 mg in p.m. (no later than 6 PM).  - You absolutely need to take this medication every day and not skip doses. - Please double the dose if you have a fever, for the duration of the fever. - If you cannot take anything by mouth (vomiting) or you have severe diarrhea so that you eliminate the hydrocortisone pills in your stool, please make sure that you get the hydrocortisone in the vein instead - go to the nearest emergency department/urgent care or you may go to your PCPs office   Please return in 1 year.

## 2020-02-14 ENCOUNTER — Other Ambulatory Visit: Payer: Self-pay

## 2020-02-14 ENCOUNTER — Other Ambulatory Visit (INDEPENDENT_AMBULATORY_CARE_PROVIDER_SITE_OTHER): Payer: 59

## 2020-02-14 DIAGNOSIS — E893 Postprocedural hypopituitarism: Secondary | ICD-10-CM | POA: Diagnosis not present

## 2020-02-14 DIAGNOSIS — M8580 Other specified disorders of bone density and structure, unspecified site: Secondary | ICD-10-CM | POA: Diagnosis not present

## 2020-02-14 DIAGNOSIS — M859 Disorder of bone density and structure, unspecified: Secondary | ICD-10-CM

## 2020-02-14 DIAGNOSIS — R7303 Prediabetes: Secondary | ICD-10-CM

## 2020-02-14 LAB — T4, FREE: Free T4: 0.84 ng/dL (ref 0.60–1.60)

## 2020-02-14 LAB — HEMOGLOBIN A1C: Hgb A1c MFr Bld: 5.8 % (ref 4.6–6.5)

## 2020-02-14 LAB — T3, FREE: T3, Free: 3.2 pg/mL (ref 2.3–4.2)

## 2020-02-15 ENCOUNTER — Encounter: Payer: Self-pay | Admitting: Internal Medicine

## 2020-02-15 ENCOUNTER — Other Ambulatory Visit: Payer: Self-pay | Admitting: Internal Medicine

## 2020-02-15 LAB — VITAMIN D 25 HYDROXY (VIT D DEFICIENCY, FRACTURES): Vit D, 25-Hydroxy: 19.9 ng/mL — ABNORMAL LOW (ref 30.0–100.0)

## 2020-02-15 MED ORDER — LEVOTHYROXINE SODIUM 100 MCG PO TABS
100.0000 ug | ORAL_TABLET | Freq: Every day | ORAL | 3 refills | Status: DC
Start: 2020-02-15 — End: 2020-02-15

## 2020-02-15 MED FILL — LEVOTHYROXINE 100 MCG TAB: 100 | 90 days supply | Qty: 90 | Fill #0

## 2020-02-15 NOTE — Addendum Note (Signed)
Addended by: Philemon Kingdom on: 02/15/2020 09:47 AM   Modules accepted: Orders

## 2020-05-13 MED FILL — LEVOTHYROXINE 100 MCG TAB: 100 | 90 days supply | Qty: 90 | Fill #1

## 2020-05-26 ENCOUNTER — Other Ambulatory Visit: Payer: Self-pay | Admitting: *Deleted

## 2020-05-26 ENCOUNTER — Encounter: Payer: Self-pay | Admitting: Internal Medicine

## 2020-05-26 ENCOUNTER — Other Ambulatory Visit: Payer: Self-pay | Admitting: Internal Medicine

## 2020-05-26 MED ORDER — HYDROCORTISONE NA SUCCINATE PF 100 MG IJ SOLR: INTRAMUSCULAR | 99 refills | Status: DC

## 2020-05-26 MED ORDER — HYDROCORTISONE 5 MG PO TABS: ORAL_TABLET | ORAL | 3 refills | Status: DC

## 2020-05-26 MED FILL — HYDROCORTISONE 5 MG TABLET: 5 | 90 days supply | Qty: 270 | Fill #0

## 2020-05-26 MED FILL — SOLU-CORTEF (PF) 100 MG VIA: 100 | 2 days supply | Qty: 2 | Fill #0

## 2020-07-01 DIAGNOSIS — H40013 Open angle with borderline findings, low risk, bilateral: Secondary | ICD-10-CM | POA: Diagnosis not present

## 2020-07-01 DIAGNOSIS — D352 Benign neoplasm of pituitary gland: Secondary | ICD-10-CM | POA: Diagnosis not present

## 2020-08-12 DIAGNOSIS — E893 Postprocedural hypopituitarism: Secondary | ICD-10-CM | POA: Diagnosis not present

## 2020-08-12 DIAGNOSIS — Z13220 Encounter for screening for lipoid disorders: Secondary | ICD-10-CM | POA: Diagnosis not present

## 2020-08-12 DIAGNOSIS — Z0000 Encounter for general adult medical examination without abnormal findings: Secondary | ICD-10-CM | POA: Diagnosis not present

## 2020-08-12 DIAGNOSIS — K760 Fatty (change of) liver, not elsewhere classified: Secondary | ICD-10-CM | POA: Diagnosis not present

## 2020-08-15 ENCOUNTER — Encounter: Payer: Self-pay | Admitting: Internal Medicine

## 2020-08-18 ENCOUNTER — Other Ambulatory Visit: Payer: Self-pay | Admitting: *Deleted

## 2020-08-18 ENCOUNTER — Other Ambulatory Visit (HOSPITAL_COMMUNITY): Payer: Self-pay

## 2020-08-18 MED ORDER — LEVOTHYROXINE SODIUM 100 MCG PO TABS
ORAL_TABLET | ORAL | 3 refills | Status: DC
Start: 2020-08-18 — End: 2021-02-12
  Filled 2020-08-18: qty 90, 90d supply, fill #0
  Filled 2020-11-11: qty 90, 90d supply, fill #1

## 2020-09-17 ENCOUNTER — Other Ambulatory Visit (HOSPITAL_COMMUNITY): Payer: Self-pay

## 2020-09-17 MED ORDER — PROMETHAZINE-DM 6.25-15 MG/5ML PO SYRP
ORAL_SOLUTION | ORAL | 0 refills | Status: AC
  Filled 2020-09-17: qty 120, 6d supply, fill #0

## 2020-09-26 ENCOUNTER — Other Ambulatory Visit: Payer: 59

## 2020-09-29 ENCOUNTER — Other Ambulatory Visit: Payer: Self-pay

## 2020-09-29 ENCOUNTER — Other Ambulatory Visit (INDEPENDENT_AMBULATORY_CARE_PROVIDER_SITE_OTHER): Payer: 59

## 2020-09-29 DIAGNOSIS — M859 Disorder of bone density and structure, unspecified: Secondary | ICD-10-CM

## 2020-09-29 DIAGNOSIS — E893 Postprocedural hypopituitarism: Secondary | ICD-10-CM

## 2020-09-29 DIAGNOSIS — M8580 Other specified disorders of bone density and structure, unspecified site: Secondary | ICD-10-CM | POA: Diagnosis not present

## 2020-09-29 DIAGNOSIS — E559 Vitamin D deficiency, unspecified: Secondary | ICD-10-CM

## 2020-09-29 LAB — T3, FREE: T3, Free: 3.7 pg/mL (ref 2.3–4.2)

## 2020-09-29 LAB — T4, FREE: Free T4: 0.94 ng/dL (ref 0.60–1.60)

## 2020-09-30 LAB — VITAMIN D 25 HYDROXY (VIT D DEFICIENCY, FRACTURES): Vit D, 25-Hydroxy: 51.4 ng/mL (ref 30.0–100.0)

## 2020-10-21 ENCOUNTER — Encounter: Payer: Self-pay | Admitting: Internal Medicine

## 2020-11-12 ENCOUNTER — Other Ambulatory Visit (HOSPITAL_COMMUNITY): Payer: Self-pay

## 2020-11-15 ENCOUNTER — Other Ambulatory Visit (HOSPITAL_COMMUNITY): Payer: Self-pay

## 2020-12-10 DIAGNOSIS — Z113 Encounter for screening for infections with a predominantly sexual mode of transmission: Secondary | ICD-10-CM | POA: Diagnosis not present

## 2020-12-10 DIAGNOSIS — Z6822 Body mass index (BMI) 22.0-22.9, adult: Secondary | ICD-10-CM | POA: Diagnosis not present

## 2020-12-10 DIAGNOSIS — Z01411 Encounter for gynecological examination (general) (routine) with abnormal findings: Secondary | ICD-10-CM | POA: Diagnosis not present

## 2020-12-10 DIAGNOSIS — E28319 Asymptomatic premature menopause: Secondary | ICD-10-CM | POA: Diagnosis not present

## 2020-12-10 DIAGNOSIS — Z01419 Encounter for gynecological examination (general) (routine) without abnormal findings: Secondary | ICD-10-CM | POA: Diagnosis not present

## 2020-12-10 DIAGNOSIS — Z124 Encounter for screening for malignant neoplasm of cervix: Secondary | ICD-10-CM | POA: Diagnosis not present

## 2020-12-10 DIAGNOSIS — Z3041 Encounter for surveillance of contraceptive pills: Secondary | ICD-10-CM | POA: Diagnosis not present

## 2020-12-11 DIAGNOSIS — Z111 Encounter for screening for respiratory tuberculosis: Secondary | ICD-10-CM | POA: Diagnosis not present

## 2021-01-01 ENCOUNTER — Other Ambulatory Visit (HOSPITAL_COMMUNITY): Payer: Self-pay

## 2021-01-01 MED FILL — Hydrocortisone Tab 5 MG: ORAL | 90 days supply | Qty: 270 | Fill #0 | Status: AC

## 2021-01-02 ENCOUNTER — Other Ambulatory Visit (HOSPITAL_COMMUNITY): Payer: Self-pay

## 2021-01-05 ENCOUNTER — Other Ambulatory Visit (HOSPITAL_COMMUNITY): Payer: Self-pay

## 2021-02-12 ENCOUNTER — Other Ambulatory Visit: Payer: Self-pay | Admitting: Internal Medicine

## 2021-02-12 ENCOUNTER — Other Ambulatory Visit (HOSPITAL_COMMUNITY): Payer: Self-pay

## 2021-02-12 ENCOUNTER — Ambulatory Visit: Payer: 59 | Admitting: Internal Medicine

## 2021-02-12 ENCOUNTER — Other Ambulatory Visit: Payer: Self-pay

## 2021-02-12 ENCOUNTER — Encounter: Payer: Self-pay | Admitting: Internal Medicine

## 2021-02-12 VITALS — BP 128/82 | HR 51 | Ht 63.0 in | Wt 127.4 lb

## 2021-02-12 DIAGNOSIS — R7303 Prediabetes: Secondary | ICD-10-CM

## 2021-02-12 DIAGNOSIS — M859 Disorder of bone density and structure, unspecified: Secondary | ICD-10-CM | POA: Diagnosis not present

## 2021-02-12 DIAGNOSIS — E893 Postprocedural hypopituitarism: Secondary | ICD-10-CM

## 2021-02-12 DIAGNOSIS — E559 Vitamin D deficiency, unspecified: Secondary | ICD-10-CM | POA: Diagnosis not present

## 2021-02-12 LAB — VITAMIN D 25 HYDROXY (VIT D DEFICIENCY, FRACTURES): VITD: 36.21 ng/mL (ref 30.00–100.00)

## 2021-02-12 LAB — T3, FREE: T3, Free: 2.8 pg/mL (ref 2.3–4.2)

## 2021-02-12 LAB — T4, FREE: Free T4: 0.91 ng/dL (ref 0.60–1.60)

## 2021-02-12 LAB — HEMOGLOBIN A1C: Hgb A1c MFr Bld: 5.9 % (ref 4.6–6.5)

## 2021-02-12 MED ORDER — LEVOTHYROXINE SODIUM 100 MCG PO TABS
ORAL_TABLET | ORAL | 3 refills | Status: DC
  Filled 2021-02-12: qty 90, 90d supply, fill #0
  Filled 2021-05-19: qty 90, 90d supply, fill #1
  Filled 2021-08-18: qty 90, 90d supply, fill #2
  Filled 2021-11-03 – 2021-11-16 (×2): qty 90, 90d supply, fill #3

## 2021-02-12 NOTE — Patient Instructions (Signed)
Please come back for labs.  Please continue levothyroxine 100 mcg daily.  Take the thyroid hormone every day, with water, at least 30 minutes before breakfast, separated by at least 4 hours from: - acid reflux medications - calcium - iron - multivitamins  Please continue hydrocortisone 10 mg in a.m. and 5 mg in p.m. (no later than 6 PM).  - You absolutely need to take this medication every day and not skip doses. - Please double the dose if you have a fever, for the duration of the fever. - If you cannot take anything by mouth (vomiting) or you have severe diarrhea so that you eliminate the hydrocortisone pills in your stool, please make sure that you get the hydrocortisone in the vein instead - go to the nearest emergency department/urgent care or you may go to your PCPs office   Please return in 1 year.

## 2021-02-12 NOTE — Progress Notes (Signed)
Patient ID: Marisa Galloway, female   DOB: 09/10/1980, 40 y.o.   MRN: [id number]  This visit occurred during the SARS-CoV-2 public health emergency.  Safety protocols were in place, including screening questions prior to the visit, additional usage of staff PPE, and extensive cleaning of exam room while observing appropriate contact time as indicated for disinfecting solutions.   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 She was previously seen by Dr Chalmers Cater. Last visit with me 1 year ago.  Interim history: She denies persistent fatigue (but she does occasionally have fatigue especially when she works long hours - very busy at work), nausea, abdominal pain,  persistent headaches, vision changes. She had Covid 08/2020 - doubled up [facility] for 3 days.   Reviewed history: Pt. 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 She had VF checked after the surgery >> normalized.   10/10/2015: Eye and visual field exam: - Vision 20/20 both eyes  - IOP 15 both eyes  - Visual fields: Complete resolution of bitemporal hemianopsia!  Pituitary MRI (01/26/2016): No change from the prior study. Postop pituitary resection. No residual pituitary tissue identified. Infundibulum not identified. No significant abnormality in the remainder of the brain.  04/2018: VF normal 04/2019: VF normal reportedly  Pituitary MRI (11/15/2019): No residual pituitary tissue identified or other suspicious masses  Central hypothyroidism: - on Levothyroxine 100 mcg daily (increased at last visit) - in am - fasting - at  least 30 min from b'fast - + calcium >4h later - no iron - + multivitamins at lunchtime - no PPIs - not on Biotin  Reviewed her thyroid tests: Lab Results  Component Value Date   FREET4 0.94 09/29/2020   FREET4 0.84 02/14/2020   FREET4 0.84 02/06/2019   FREET4 0.79 02/06/2018   FREET4 1.01 02/04/2017   FREET4 0.89 04/08/2016   FREET4 0.69 02/06/2016   FREET4 0.80 02/06/2015   FREET4 0.85 01/22/2014  Per records from Dr Chalmers Cater: 12/23/2011: free t4: 1.08 Lab Results  Component Value Date   T3FREE 3.7 09/29/2020   T3FREE 3.2 02/14/2020   T3FREE 3.6 02/06/2019   T3FREE 3.2 02/06/2018   T3FREE 4.2 02/04/2017   T3FREE 3.0 04/08/2016   T3FREE 4.0 02/06/2016   Central adrenal insufficiency On hydrocortisone -10 mg in a.m. and 5 mg in p.m. -She did not have to use higher doses of cortisone or parenteral steroid solution since last visit.  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 hormone 07/03/2013: IGF-I 34 (73-243) 06/21/2012: IGF-I 34 (73-243) 12/23/2011 IGF-I 19 (77-271)  She also has low BMD for age: 16/26/2021 Lumbar spine  L1-L4 Femoral neck (FN)  Z-score -0.2 RFN: -1.8 LFN: -2.2  Change in BMD from previous DXA test (%)  +3.2%* -0.7%  (*) statistically significant   02/16/2017 Lumbar spine L1-L4 Femoral neck (FN)  Z-score -0.5 RFN: -1.9 LFN: -2.2  Change in BMD from previous DXA test (%) Down 0.9% Up 1.7%  (*) statistically  significant    Lumbar spine (L1-L4) Z score Femoral neck (FN) Z score   02/18/2015 ([place])  -0.3   RFN: -1.8 LFN: -2.3   06/21/2012  -0.7 RFN: -2.1 LFN: -2.0   She continues on calcium carbonate + vitamin D (600 mg +400 units) once a day + 1000 units vitamin D daily.  Her vitamin D levels have been normal: Lab Results  Component Value Date   VD25OH 51.4 09/29/2020   VD25OH 19.9 (L) 02/14/2020   VD25OH 37.37 02/06/2019   VD25OH 53.53 02/06/2018   VD25OH 55.96 02/04/2017   VD25OH 59.36 02/06/2016   VD25OH 57.41 04/18/2015   She continues to exercise regularly. She had a stress fracture in the past but this healed she was able to restart exercise. She was running between 15 to 25 mi a week, but decreased the amount of exercise afterwards due to being busy at work.  HbA1c is low in the prediabetic range, stable: Lab Results  Component Value Date   HGBA1C 5.8 02/14/2020   HGBA1C 5.7 02/06/2019   HGBA1C 5.8 02/06/2018   HGBA1C 5.7 02/04/2017   HGBA1C 5.7 02/06/2016   HGBA1C 5.8 02/06/2015   At previous visits, she was drinking more regular sodas as she did not drink coffee or tea.  She reduced these afterwards and alternating regular with diet sodas >> no regular sodas now.  ROS: + see HPI  I reviewed pt's medications, allergies, PMH, social hx, family hx, and changes were documented in the history of present illness. Otherwise, unchanged from my initial visit note.  Past Medical History:  Diagnosis Date   Headache(784.0)    occasionally   Hypopituitarism after adenoma resection ([place]) 01/21/2014   Pituitary tumor    Seasonal allergies    doesn't require meds   Vision changes    bilateral hemianopsia   Past Surgical History:  Procedure Laterality Date   BRAIN SURGERY     CRANIOTOMY  05/18/2011   Procedure: CRANIOTOMY HYPOPHYSECTOMY TRANSNASAL APPROACH;  Surgeon: Hosie Spangle, MD;  Location: [place] [facility];  Service: Neurosurgery;  Laterality: N/A;  Transphenoidal  resection of pituitary tumor with Dr Wilburn Cornelia Fat graft from abdomen   CRANIOTOMY  09/29/2011   Procedure: CRANIOTOMY TUMOR EXCISION;  Surgeon: Hosie Spangle, MD;  Location: [place] [facility];  Service: Neurosurgery;  Laterality: N/A;  Craniotomy for resection of pituitary tumor   NASAL SINUS SURGERY  05/18/2011   Procedure: ENDOSCOPIC SINUS SURGERY WITH STEALTH;  Surgeon: Delsa Bern, MD;  Location: [facility];  Service: ENT;  Laterality: N/A;  Opening for Transphenoidal for pituitary tumor   SALIVARY GLAND SURGERY  08/09/2007   History   Social History   Marital Status: Single    Spouse Name: N/A    Number of Children: 0   Occupational History   RN - [employer]   Social History Main Topics   Smoking status: Never Smoker    Smokeless tobacco: Not on file   Alcohol Use: Yes     Comment: rare glass of wine   Drug Use: No   Sexual Activity: Yes    Birth Control/ Protection: Pill  Current Outpatient Medications on File Prior to Visit  Medication Sig Dispense Refill   Calcium Carb-Cholecalciferol (CALCIUM-VITAMIN D) 600-400 MG-UNIT TABS Take 1 tablet by mouth daily.     Cholecalciferol (VITAMIN D PO) Take 1,000 Units by mouth.     drospirenone-ethinyl estradiol (YAZ,GIANVI,LORYNA) 3-0.02 MG tablet Take 1 tablet by mouth daily.     hydrocortisone (CORTEF) 5 MG tablet TAKE 2 TABLETS BY MOUTH EVERY MORNING AND 1 TABLET IN THE EVENING 300 tablet 3   hydrocortisone sodium succinate (SOLU-CORTEF) 100 MG SOLR injection PLEASE INJECT IN THE MUSCLE IF YOU CANNOT TAKE THE HYDROCORTISONE MEDICATION BY MOUTH. 2 each 99   levothyroxine (SYNTHROID) 100 MCG tablet TAKE 1 TABLET BY MOUTH ONCE A DAY BEFORE BREAKFAST 45 tablet 3   Multiple Vitamin (MULTIVITAMIN) tablet Take 1 tablet by mouth daily.     promethazine-dextromethorphan (PROMETHAZINE-DM) 6.25-15 MG/5ML syrup Take 5 mls by mouth every 6 hours as needed for cough. 120 mL 0   Syringe/Needle, Disp, (SYRINGE 3CC/25GX1") 25G X 1" 3 ML MISC  Inject in the muscle as needed - use for solucortef 50 each prn   No current facility-administered medications on file prior to visit.    No Known Allergies   No family history on file.  PE: BP 128/82 (BP Location: Right Arm, Patient Position: Sitting, Cuff Size: Normal)   Pulse (!) 51   Ht 5\' 3"  (1.6 m)   Wt 127 lb 6.4 oz (57.8 kg)   SpO2 99%   BMI 22.57 kg/m  Body mass index is 22.57 kg/m. Wt Readings from Last 3 Encounters:  02/12/21 127 lb 6.4 oz (57.8 kg)  02/07/20 123 lb 6.4 oz (56 kg)  02/06/19 124 lb (56.2 kg)   Constitutional: normal weight, in NAD Eyes: PERRLA, EOMI, no exophthalmos ENT: moist mucous membranes, no thyromegaly, no cervical lymphadenopathy Cardiovascular: RRR, No MRG Respiratory: CTA B Gastrointestinal: abdomen soft, NT, ND, BS+ Musculoskeletal: no deformities, strength intact in all 4 Skin: moist, warm, no rashes Neurological: no tremor with outstretched hands, DTR normal in all 4  ASSESSMENT: 1. Hypopituitarism - Visual field normalized: VF from 10/10/2015: Complete resolution of bitemporal hemianopsia!  2. Low bone mineral density for age  104. Prediabetes  PLAN:  1. Hypopituitarism -Reviewed the report of her most recent MRI from 11/15/2019: No recurrences of her previous pituitary tumor a visual field was normal in 04/2019.  No further visual fields are needed for her.  A. Central hypothyroidism - latest thyroid labs reviewed with pt. >> normal - she continues on LT4 100 mcg daily, dose increased at last visit as fT4 was in the lower part of the normal range - pt feels good on this dose. - we discussed about taking the thyroid hormone every day, with water, >30 minutes before breakfast, separated by >4 hours from acid reflux medications, calcium, iron, multivitamins. Pt. is taking it correctly. - will check thyroid tests today: Free T4 and free T3 - If labs are abnormal, she will need to return for repeat TFTs in 1.5 months  B. Central  adrenal insufficiency -She continues on hydrocortisone 10 mg in a.m. and 5 mg in p.m. -No complaints at today's visit, no significant fatigue -She did not have to apply sick day rules since last visit: If you cannot keep anything down, including your hydrocortisone medication, please go to the emergency room or your primary care physician office to get steroids injected in the muscle or vein. If you have a fever (more than 100 Fahrenheit), please  double the dose of your hydrocortisone for the duration of the fever. Do not run out of your hydrocortisone medication. -She does have a med alert bracelet mentioning adrenal insufficiency  C. Hypogonadotropic hypogonadism -She continues on OCPs -Sees Dr. Ronita Hipps -No immediate plans for pregnancy  D. GH insufficiency -She continues to have a low IGF-I -At previous visits, we did discuss about the indication for growth hormone treatment and possible benefits/side effects. She decided to forego starting this, especially since the cardiovascular benefits of replacement are not clearly defined and the fact that it is a daily injectable medicine.  E. No signs of DI.  -No increased thirst or urination   2. Low bone mineral density -Reviewed latest bone density report from 08/29/2019: Her Z score has increased in the spine and was stable in the hips -At last visit she was on vitamin D 1400 units daily but vitamin D was low (19.9) so I advised her to double up on the dose (she forgot to do so, but did start to take the vitamin D consistently) >> vitamin D was excellent in 09/2020 -Also on calcium daily -She continues exercise  3.  Prediabetes -Mild, her HbA1c levels have been in the lower range of prediabetes: Lab Results  Component Value Date   HGBA1C 5.8 02/14/2020  -We will recheck this at the next lab draw -We discussed in the past about stopping regular sodas.  She stopped since.  Needs refills LT4.  Component     Latest Ref Rng & Units  02/12/2021  T4,Free(Direct)     0.60 - 1.60 ng/dL 0.91  Triiodothyronine,Free,Serum     2.3 - 4.2 pg/mL 2.8  VITD     30.00 - 100.00 ng/mL 36.21  Hemoglobin A1C     4.6 - 6.5 % 5.9  HbA1c is a little higher, but the rest of the labs are within the normal range.  Philemon Kingdom, MD PhD [facility] Endocrinology

## 2021-05-19 ENCOUNTER — Other Ambulatory Visit (HOSPITAL_COMMUNITY): Payer: Self-pay

## 2021-07-03 DIAGNOSIS — H40013 Open angle with borderline findings, low risk, bilateral: Secondary | ICD-10-CM | POA: Diagnosis not present

## 2021-07-09 ENCOUNTER — Encounter: Payer: Self-pay | Admitting: Internal Medicine

## 2021-07-15 ENCOUNTER — Other Ambulatory Visit: Payer: Self-pay | Admitting: Internal Medicine

## 2021-07-15 DIAGNOSIS — E893 Postprocedural hypopituitarism: Secondary | ICD-10-CM

## 2021-07-15 DIAGNOSIS — Z3183 Encounter for assisted reproductive fertility procedure cycle: Secondary | ICD-10-CM

## 2021-07-15 DIAGNOSIS — Z52819 Egg (Oocyte) donor, unspecified: Secondary | ICD-10-CM

## 2021-07-15 NOTE — Progress Notes (Signed)
re

## 2021-08-13 DIAGNOSIS — E893 Postprocedural hypopituitarism: Secondary | ICD-10-CM | POA: Diagnosis not present

## 2021-08-13 DIAGNOSIS — K760 Fatty (change of) liver, not elsewhere classified: Secondary | ICD-10-CM | POA: Diagnosis not present

## 2021-08-13 DIAGNOSIS — R7303 Prediabetes: Secondary | ICD-10-CM | POA: Diagnosis not present

## 2021-08-13 DIAGNOSIS — Z0000 Encounter for general adult medical examination without abnormal findings: Secondary | ICD-10-CM | POA: Diagnosis not present

## 2021-08-13 DIAGNOSIS — Z13220 Encounter for screening for lipoid disorders: Secondary | ICD-10-CM | POA: Diagnosis not present

## 2021-08-18 ENCOUNTER — Other Ambulatory Visit: Payer: Self-pay | Admitting: Internal Medicine

## 2021-08-18 ENCOUNTER — Other Ambulatory Visit (HOSPITAL_COMMUNITY): Payer: Self-pay

## 2021-08-19 ENCOUNTER — Other Ambulatory Visit: Payer: Self-pay | Admitting: Internal Medicine

## 2021-08-19 ENCOUNTER — Other Ambulatory Visit (HOSPITAL_COMMUNITY): Payer: Self-pay

## 2021-08-20 ENCOUNTER — Other Ambulatory Visit (HOSPITAL_COMMUNITY): Payer: Self-pay

## 2021-08-20 MED ORDER — HYDROCORTISONE 5 MG PO TABS
ORAL_TABLET | ORAL | 2 refills | Status: DC
  Filled 2021-08-20: qty 270, 90d supply, fill #0
  Filled 2021-11-03: qty 300, 90d supply, fill #0
  Filled 2022-02-20 – 2022-05-18 (×2): qty 300, 90d supply, fill #1
  Filled 2022-08-20: qty 300, 90d supply, fill #2

## 2021-08-21 ENCOUNTER — Other Ambulatory Visit (HOSPITAL_COMMUNITY): Payer: Self-pay

## 2021-08-26 ENCOUNTER — Other Ambulatory Visit (HOSPITAL_COMMUNITY): Payer: Self-pay

## 2021-08-27 DIAGNOSIS — J069 Acute upper respiratory infection, unspecified: Secondary | ICD-10-CM | POA: Diagnosis not present

## 2021-09-02 ENCOUNTER — Other Ambulatory Visit (HOSPITAL_COMMUNITY): Payer: Self-pay

## 2021-09-22 DIAGNOSIS — Z3162 Encounter for fertility preservation counseling: Secondary | ICD-10-CM | POA: Diagnosis not present

## 2021-09-22 DIAGNOSIS — E288 Other ovarian dysfunction: Secondary | ICD-10-CM | POA: Diagnosis not present

## 2021-10-10 LAB — GLUCOSE, POCT (MANUAL RESULT ENTRY): POC Glucose: 76 mg/dl (ref 70–99)

## 2021-11-03 ENCOUNTER — Other Ambulatory Visit (HOSPITAL_COMMUNITY): Payer: Self-pay

## 2021-11-04 ENCOUNTER — Other Ambulatory Visit (HOSPITAL_COMMUNITY): Payer: Self-pay

## 2021-11-10 ENCOUNTER — Other Ambulatory Visit (HOSPITAL_COMMUNITY): Payer: Self-pay

## 2021-11-12 ENCOUNTER — Other Ambulatory Visit (HOSPITAL_COMMUNITY): Payer: Self-pay

## 2021-11-16 ENCOUNTER — Other Ambulatory Visit (HOSPITAL_COMMUNITY): Payer: Self-pay

## 2022-02-11 ENCOUNTER — Other Ambulatory Visit: Payer: Self-pay | Admitting: Family Medicine

## 2022-02-11 DIAGNOSIS — R42 Dizziness and giddiness: Secondary | ICD-10-CM

## 2022-02-11 DIAGNOSIS — Z86011 Personal history of benign neoplasm of the brain: Secondary | ICD-10-CM

## 2022-02-16 ENCOUNTER — Encounter: Payer: Self-pay | Admitting: *Deleted

## 2022-02-16 NOTE — Progress Notes (Signed)
Pt states she still sees Dr. Marisue Humble at [facility] and indeed f/u with him last week. She normally sees him yearly and her endocrinologist Dr. Cruzita Lederer yearly(next appt 11/23) Both drs are following her medical needs and she did not have any current additional SDOH or healthcare needs beyond what these physicians are providing.

## 2022-02-17 ENCOUNTER — Encounter: Payer: Self-pay | Admitting: Internal Medicine

## 2022-02-17 ENCOUNTER — Ambulatory Visit: Payer: 59 | Admitting: Internal Medicine

## 2022-02-17 ENCOUNTER — Other Ambulatory Visit (HOSPITAL_COMMUNITY): Payer: Self-pay

## 2022-02-17 VITALS — BP 128/82 | Ht 63.0 in | Wt 133.2 lb

## 2022-02-17 DIAGNOSIS — M859 Disorder of bone density and structure, unspecified: Secondary | ICD-10-CM

## 2022-02-17 DIAGNOSIS — R7303 Prediabetes: Secondary | ICD-10-CM | POA: Diagnosis not present

## 2022-02-17 DIAGNOSIS — E559 Vitamin D deficiency, unspecified: Secondary | ICD-10-CM | POA: Diagnosis not present

## 2022-02-17 DIAGNOSIS — E893 Postprocedural hypopituitarism: Secondary | ICD-10-CM | POA: Diagnosis not present

## 2022-02-17 LAB — HEMOGLOBIN A1C: Hgb A1c MFr Bld: 6 % (ref 4.6–6.5)

## 2022-02-17 LAB — VITAMIN D 25 HYDROXY (VIT D DEFICIENCY, FRACTURES): VITD: 57.46 ng/mL (ref 30.00–100.00)

## 2022-02-17 MED ORDER — LEVOTHYROXINE SODIUM 100 MCG PO TABS
100.0000 ug | ORAL_TABLET | Freq: Every day | ORAL | 3 refills | Status: DC
  Filled 2022-02-17: qty 90, 90d supply, fill #0
  Filled 2022-05-18: qty 90, 90d supply, fill #1
  Filled 2022-08-20: qty 90, 90d supply, fill #2
  Filled 2022-11-15: qty 90, 90d supply, fill #3

## 2022-02-17 NOTE — Patient Instructions (Signed)
Please stop at the lab.  Please continue levothyroxine 100 mcg daily.  Take the thyroid hormone every day, with water, at least 30 minutes before breakfast, separated by at least 4 hours from: - acid reflux medications - calcium - iron - multivitamins  Please continue hydrocortisone 10 mg in a.m. and 5 mg in p.m. (no later than 6 PM).  - You absolutely need to take this medication every day and not skip doses. - Please double the dose if you have a fever, for the duration of the fever. - If you cannot take anything by mouth (vomiting) or you have severe diarrhea so that you eliminate the hydrocortisone pills in your stool, please make sure that you get the hydrocortisone in the vein instead - go to the nearest emergency department/urgent care or you may go to your PCPs office   Please return in 1 year.

## 2022-02-17 NOTE — Progress Notes (Signed)
Patient ID: Marisa Galloway, female   DOB: [birth year], 41 y.o.   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 She was previously seen by Dr Marisa Galloway. Last visit with me 1 year ago.  Interim history: She denies persistent fatigue, nausea, headaches, vision changes. She has some dizziness - orthostatic. MRI pending at the end of the mo. After last visit, she requested a referral to fertility specialist  -she now states that [place].  She is in the process follicular harvesting -for now has to check with her insurance if this procedure is covered. She continues to exercise, mostly strength training with a Physiological scientist. She is very busy at work -long hours.  Reviewed history: Pt. has been dx with a nonsecreting pituitary adenoma in 04/2011 >> noticed a change in vision >> changed glasses >> vision still deteriorating >> VF abnormal >> MRI pituitary >> pituitary tumor >> 05/2011 TSR >> 09/2011 craniotomy by Dr Marisa Galloway as there was still remnant pituitary tumor in the sella. 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 She had VF checked after the surgery >> normalized.   10/10/2015: Eye and visual field exam: - Vision 20/20 both eyes  - IOP 15 both eyes  - Visual fields: Complete resolution of bitemporal hemianopsia!  Pituitary MRI (01/26/2016): No change from the prior study. Postop pituitary resection. No residual pituitary tissue identified. Infundibulum not identified. No significant abnormality in the remainder of the brain.  04/2018: VF normal 04/2019: VF normal reportedly  Pituitary MRI (11/15/2019): No residual pituitary tissue identified or other suspicious masses  Central hypothyroidism: - on Levothyroxine 100 mcg daily: - in am - fasting - at least 30 min from b'fast - + calcium >4h later - no iron - + multivitamins at lunchtime -  no PPIs - not on Biotin  Reviewed her thyroid tests: 02/11/2022: Free T4 0.97 (0.61-1.12), free T3 3.44 (2.5-3.9) Lab Results  Component Value Date   FREET4 0.91 02/12/2021   FREET4 0.94 09/29/2020   FREET4 0.84 02/14/2020   FREET4 0.84 02/06/2019   FREET4 0.79 02/06/2018   FREET4 1.01 02/04/2017   FREET4 0.89 04/08/2016   FREET4 0.69 02/06/2016   FREET4 0.80 02/06/2015   FREET4 0.85 01/22/2014  Per records from Dr Marisa Galloway: 12/23/2011: free t4: 1.08 Lab Results  Component Value Date   T3FREE 2.8 02/12/2021   T3FREE 3.7 09/29/2020   T3FREE 3.2 02/14/2020   T3FREE 3.6 02/06/2019   T3FREE 3.2 02/06/2018   T3FREE 4.2 02/04/2017   T3FREE 3.0 04/08/2016   T3FREE 4.0 02/06/2016   Central adrenal insufficiency On hydrocortisone -10 mg in a.m. and 5 mg in p.m. -She did not have to use higher doses of cortisone or parenteral steroid solution since last visit.  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 Recent levels were normal: Lab Results  Component Value Date   PROLACTIN 21.8 02/06/2018   PROLACTIN 18.2 02/06/2016   PROLACTIN 24.9 04/18/2015   PROLACTIN 20.9 02/06/2015   PROLACTIN 0.7 01/22/2014   PROLACTIN 41.6 05/17/2011  04/11/2013: Prolactin 0.7 12/20/2012: Prolactin <0.1 12/23/2011: Prolactin 42.5  GH deficiency She is not on growth hormone 07/03/2013: IGF-I 34 (73-243) 06/21/2012: IGF-I  34 (73-243) 12/23/2011 IGF-I 19 (77-271)  She also has low BMD for age: 73/26/2021 Lumbar spine L1-L4 Femoral neck (FN)  Z-score -0.2 RFN: -1.8 LFN: -2.2  Change in BMD from previous DXA test (%)  +3.2%* -0.7%  (*) statistically significant   02/16/2017 Lumbar spine L1-L4 Femoral neck (FN)  Z-score -0.5 RFN: -1.9 LFN: -2.2  Change in  BMD from previous DXA test (%) Down 0.9% Up 1.7%  (*) statistically significant    Lumbar spine (L1-L4) Z score Femoral neck (FN) Z score   02/18/2015 ([place])  -0.3   RFN: -1.8 LFN: -2.3   06/21/2012  -0.7 RFN: -2.1 LFN: -2.0   She continues on calcium carbonate + vitamin D (600 mg +400 units) once a day + 1000 units vitamin D daily.  Her vitamin D levels have been normal: Lab Results  Component Value Date   VD25OH 36.21 02/12/2021   VD25OH 51.4 09/29/2020   VD25OH 19.9 (L) 02/14/2020   VD25OH 37.37 02/06/2019   VD25OH 53.53 02/06/2018   VD25OH 55.96 02/04/2017   VD25OH 59.36 02/06/2016   VD25OH 57.41 04/18/2015   She continues to exercise regularly. She had a stress fracture in the past but this healed she was able to restart exercise. She was running between 15 to 25 mi a week, but decreased the amount of exercise afterwards due to being busy at work.  HbA1c is low in the prediabetic range: Lab Results  Component Value Date   HGBA1C 5.9 02/12/2021   HGBA1C 5.8 02/14/2020   HGBA1C 5.7 02/06/2019   HGBA1C 5.8 02/06/2018   HGBA1C 5.7 02/04/2017   HGBA1C 5.7 02/06/2016   HGBA1C 5.8 02/06/2015   At previous visits, she was drinking more regular sodas as she did not drink coffee or tea.  She reduced these afterwards and alternating regular with diet sodas >> no regular sodas now.  ROS: + see HPI  I reviewed pt's medications, allergies, PMH, social hx, family hx, and changes were documented in the history of present illness. Otherwise, unchanged from my initial visit note.  Past Medical History:  Diagnosis Date   Headache(784.0)    occasionally   Hypopituitarism after adenoma resection ([place]) 01/21/2014   Pituitary tumor    Seasonal allergies    doesn't require meds   Vision changes    bilateral hemianopsia   Past Surgical History:  Procedure Laterality Date   BRAIN SURGERY     CRANIOTOMY  05/18/2011   Procedure: CRANIOTOMY HYPOPHYSECTOMY TRANSNASAL APPROACH;   Surgeon: Marisa Spangle, MD;  Location: [facility];  Service: Neurosurgery;  Laterality: N/A;  Transphenoidal resection of pituitary tumor with Dr Marisa Galloway Fat graft from abdomen   CRANIOTOMY  09/29/2011   Procedure: CRANIOTOMY TUMOR EXCISION;  Surgeon: Marisa Spangle, MD;  Location: [facility];  Service: Neurosurgery;  Laterality: N/A;  Craniotomy for resection of pituitary tumor   NASAL SINUS SURGERY  05/18/2011   Procedure: ENDOSCOPIC SINUS SURGERY WITH STEALTH;  Surgeon: Delsa Bern, MD;  Location: [facility];  Service: ENT;  Laterality: N/A;  Opening for Transphenoidal for pituitary tumor   SALIVARY GLAND SURGERY  08/09/2007   History   Social History   Marital Status: Single    Spouse Name: N/A    Number of Children: 0   Occupational History   RN - Marine scientist [employer]   Social History Main Topics   Smoking status: Never Smoker    Smokeless tobacco: Not on file   Alcohol Use: Yes  Comment: rare glass of wine   Drug Use: No   Sexual Activity: Yes    Birth Control/ Protection: Pill   Current Outpatient Medications on File Prior to Visit  Medication Sig Dispense Refill   Calcium Carb-Cholecalciferol (CALCIUM-VITAMIN D) 600-400 MG-UNIT TABS Take 1 tablet by mouth daily.     Cholecalciferol (VITAMIN D PO) Take 1,000 Units by mouth.     drospirenone-ethinyl estradiol (YAZ,GIANVI,LORYNA) 3-0.02 MG tablet Take 1 tablet by mouth daily.     hydrocortisone (CORTEF) 5 MG tablet TAKE 2 TABLETS BY MOUTH EVERY MORNING AND 1 TABLET IN THE EVENING 300 tablet 2   levothyroxine (SYNTHROID) 100 MCG tablet TAKE 1 TABLET BY MOUTH ONCE A DAY BEFORE BREAKFAST 90 tablet 3   Multiple Vitamin (MULTIVITAMIN) tablet Take 1 tablet by mouth daily.     promethazine-dextromethorphan (PROMETHAZINE-DM) 6.25-15 MG/5ML syrup Take 5 mls by mouth every 6 hours as needed for cough. 120 mL 0   Syringe/Needle, Disp, (SYRINGE 3CC/25GX1") 25G X 1" 3 ML MISC Inject in the muscle as needed - use for solucortef  50 each prn   No current facility-administered medications on file prior to visit.    No Known Allergies   No family history on file.  PE: BP 128/82 (BP Location: Right Arm, Patient Position: Sitting, Cuff Size: Normal)   Ht '5\' 3"'$  (1.6 m)   Wt 133 lb 3.2 oz (60.4 kg)   BMI 23.60 kg/m   Wt Readings from Last 3 Encounters:  02/17/22 133 lb 3.2 oz (60.4 kg)  02/12/21 127 lb 6.4 oz (57.8 kg)  02/07/20 123 lb 6.4 oz (56 kg)   Constitutional: normal weight, in NAD Eyes:  EOMI, no exophthalmos ENT: no neck masses, no cervical lymphadenopathy Cardiovascular: RRR, No MRG Respiratory: CTA B Musculoskeletal: no deformities Skin:no rashes Neurological: no tremor with outstretched hands  ASSESSMENT: 1. Hypopituitarism - Visual field normalized: VF from 10/10/2015: Complete resolution of bitemporal hemianopsia!  2. Low bone mineral density for age  78. Prediabetes  PLAN:  1. Hypopituitarism -Reviewed the report of her most recent MRI from 11/15/2019: No recurrences of her previous pituitary tumor a visual field was normal in 04/2019.  No further visual fields are needed for her. -She recently developed some dizziness, mostly orthostatic, however, Dr. Esmeralda Links ordered another MRI -pending at the end of the month A. Central hypothyroidism -We reviewed together her latest free T4 and from 02/11/2022: Excellent - she continues on LT4 100 mcg daily - pt feels good on this dose. - we discussed about taking the thyroid hormone every day, with water, >30 minutes before breakfast, separated by >4 hours from acid reflux medications, calcium, iron, multivitamins. Pt. is taking it correctly. - I refilled her levothyroxine  B. Central adrenal insufficiency -She continues on hydrocortisone 10 mg in a.m. and 5 mg in p.m. -No complaints at today's visit.  No significant fatigue. -She did not have to apply sick day rules since last visit If you cannot keep anything down, including your  hydrocortisone medication, please go to the emergency room or your primary care physician office to get steroids injected in the muscle or vein. If you have a fever (more than 100 Fahrenheit), please double the dose of your hydrocortisone for the duration of the fever. Do not run out of your hydrocortisone medication. -She does have a med alert bracelet mentioning adrenal insufficiency  C. Hypogonadotropic hypogonadism -on OCPs -Sees Dr. Ronita Hipps -No immediate plans for pregnancy, but she is preparing for follicular harvesting  D. [place] insufficiency -She has a low IGF-I -At previous visits, we did discuss about the indication for growth hormone treatment and possible benefits/side effects. She decided to forego starting this, especially since the cardiovascular benefits of replacement are not clearly defined and the fact that it is a daily injectable medicine.  E. No signs of DI.  -No increased thirst or urination   2. Low bone mineral density -Reviewed latest bone density report from 08/29/2019: Her Z score increased in spine and stable in hips -We will repeat another bone density scan 5 years from the previous -She had a low vitamin D in 2021 (19.9) so I advised her to double up on the dose (she forgot to do so, but did start to take the vitamin D consistently) >> vitamin D level was normal at last visit in 02/2021 -We will recheck the vitamin D level today -She is also on calcium daily -She continues to exercise -strength training  3.  Prediabetes -This is mild-HbA1c levels have been in the lower range of prediabetes -last level was slightly higher: Lab Results  Component Value Date   HGBA1C 5.9 02/12/2021  -We will recheck this now -She previously was drinking regular sodas, which we stopped.  Since last visit she also stopped diet sodas.  Component     Latest Ref Rng 02/17/2022  Hemoglobin A1C     4.6 - 6.5 % 6.0   VITD     30.00 - 100.00 ng/mL 57.46   HbA1c slightly higher.   Vitamin D level is better.  Philemon Kingdom, MD PhD [facility]

## 2022-02-20 ENCOUNTER — Other Ambulatory Visit (HOSPITAL_COMMUNITY): Payer: Self-pay

## 2022-02-22 ENCOUNTER — Other Ambulatory Visit (HOSPITAL_COMMUNITY): Payer: Self-pay

## 2022-02-23 ENCOUNTER — Other Ambulatory Visit (HOSPITAL_COMMUNITY): Payer: Self-pay

## 2022-02-24 ENCOUNTER — Other Ambulatory Visit (HOSPITAL_COMMUNITY): Payer: Self-pay

## 2022-03-03 ENCOUNTER — Ambulatory Visit
Admission: RE | Admit: 2022-03-03 | Discharge: 2022-03-03 | Disposition: A | Discharge status: Home / Self Care | Payer: 59 | Source: Ambulatory Visit | Attending: Family Medicine | Admitting: Family Medicine

## 2022-03-03 DIAGNOSIS — R42 Dizziness and giddiness: Secondary | ICD-10-CM | POA: Diagnosis not present

## 2022-03-03 DIAGNOSIS — Z86011 Personal history of benign neoplasm of the brain: Secondary | ICD-10-CM | POA: Diagnosis not present

## 2022-03-03 MED ORDER — GADOPICLENOL 0.5 MMOL/ML IV SOLN
6.0000 mL | Freq: Once | INTRAVENOUS | Status: AC | PRN
  Administered 2022-03-03: 6 mL via INTRAVENOUS

## 2022-03-10 ENCOUNTER — Other Ambulatory Visit (HOSPITAL_COMMUNITY): Payer: Self-pay

## 2022-05-07 ENCOUNTER — Encounter: Payer: Self-pay | Admitting: Pediatric Intensive Care

## 2022-05-07 NOTE — Progress Notes (Signed)
BP check x 2

## 2022-05-07 NOTE — Progress Notes (Signed)
Participant had slightly elevated diastolic BP on right arm so recheck on left.

## 2022-05-18 ENCOUNTER — Other Ambulatory Visit (HOSPITAL_COMMUNITY): Payer: Self-pay

## 2022-05-18 DIAGNOSIS — Z01419 Encounter for gynecological examination (general) (routine) without abnormal findings: Secondary | ICD-10-CM | POA: Diagnosis not present

## 2022-05-18 DIAGNOSIS — Z113 Encounter for screening for infections with a predominantly sexual mode of transmission: Secondary | ICD-10-CM | POA: Diagnosis not present

## 2022-05-18 DIAGNOSIS — Z01411 Encounter for gynecological examination (general) (routine) with abnormal findings: Secondary | ICD-10-CM | POA: Diagnosis not present

## 2022-05-18 DIAGNOSIS — Z6823 Body mass index (BMI) 23.0-23.9, adult: Secondary | ICD-10-CM | POA: Diagnosis not present

## 2022-05-18 DIAGNOSIS — Z124 Encounter for screening for malignant neoplasm of cervix: Secondary | ICD-10-CM | POA: Diagnosis not present

## 2022-05-19 ENCOUNTER — Encounter: Payer: Self-pay | Admitting: *Deleted

## 2022-05-19 NOTE — Progress Notes (Signed)
Pt continues to f/u with PCP and endocrinologist- received screening results (b/p) at time of the 05/07/22 [facility] event she attended most recently. No additional SDOH needs documented at that time- No additional health equity team support indicated at this time.

## 2022-05-21 ENCOUNTER — Other Ambulatory Visit (HOSPITAL_COMMUNITY): Payer: Self-pay

## 2022-07-09 DIAGNOSIS — H40013 Open angle with borderline findings, low risk, bilateral: Secondary | ICD-10-CM | POA: Diagnosis not present

## 2022-07-09 DIAGNOSIS — H5213 Myopia, bilateral: Secondary | ICD-10-CM | POA: Diagnosis not present

## 2022-07-09 DIAGNOSIS — H52223 Regular astigmatism, bilateral: Secondary | ICD-10-CM | POA: Diagnosis not present

## 2022-08-16 DIAGNOSIS — Z23 Encounter for immunization: Secondary | ICD-10-CM | POA: Diagnosis not present

## 2022-08-16 DIAGNOSIS — R7303 Prediabetes: Secondary | ICD-10-CM | POA: Diagnosis not present

## 2022-08-16 DIAGNOSIS — K760 Fatty (change of) liver, not elsewhere classified: Secondary | ICD-10-CM | POA: Diagnosis not present

## 2022-08-16 DIAGNOSIS — Z0000 Encounter for general adult medical examination without abnormal findings: Secondary | ICD-10-CM | POA: Diagnosis not present

## 2022-08-16 DIAGNOSIS — E893 Postprocedural hypopituitarism: Secondary | ICD-10-CM | POA: Diagnosis not present

## 2022-08-16 DIAGNOSIS — Z13220 Encounter for screening for lipoid disorders: Secondary | ICD-10-CM | POA: Diagnosis not present

## 2022-08-20 ENCOUNTER — Other Ambulatory Visit (HOSPITAL_COMMUNITY): Payer: Self-pay

## 2022-08-23 ENCOUNTER — Other Ambulatory Visit: Payer: Self-pay

## 2022-08-23 ENCOUNTER — Other Ambulatory Visit: Payer: Self-pay | Admitting: Internal Medicine

## 2022-08-23 ENCOUNTER — Other Ambulatory Visit (HOSPITAL_COMMUNITY): Payer: Self-pay

## 2022-08-23 MED ORDER — HYDROCORTISONE 5 MG PO TABS
ORAL_TABLET | Freq: Every morning | ORAL | 1 refills | Status: DC
  Filled 2022-08-23 – 2022-11-15 (×2): qty 270, 90d supply, fill #0
  Filled 2023-02-12: qty 270, 90d supply, fill #1

## 2022-08-24 ENCOUNTER — Other Ambulatory Visit (HOSPITAL_COMMUNITY): Payer: Self-pay

## 2022-09-01 ENCOUNTER — Other Ambulatory Visit (HOSPITAL_COMMUNITY): Payer: Self-pay

## 2022-11-15 ENCOUNTER — Other Ambulatory Visit (HOSPITAL_COMMUNITY): Payer: Self-pay

## 2022-11-20 ENCOUNTER — Other Ambulatory Visit (HOSPITAL_COMMUNITY): Payer: Self-pay

## 2023-01-21 ENCOUNTER — Other Ambulatory Visit: Payer: Self-pay | Admitting: Medical Genetics

## 2023-01-21 DIAGNOSIS — Z006 Encounter for examination for normal comparison and control in clinical research program: Secondary | ICD-10-CM

## 2023-02-12 ENCOUNTER — Other Ambulatory Visit: Payer: Self-pay | Admitting: Internal Medicine

## 2023-02-14 ENCOUNTER — Other Ambulatory Visit: Payer: Self-pay

## 2023-02-14 ENCOUNTER — Other Ambulatory Visit: Payer: Self-pay | Admitting: Family Medicine

## 2023-02-14 DIAGNOSIS — Z1231 Encounter for screening mammogram for malignant neoplasm of breast: Secondary | ICD-10-CM

## 2023-02-16 ENCOUNTER — Encounter: Payer: Self-pay | Admitting: Internal Medicine

## 2023-02-17 ENCOUNTER — Ambulatory Visit
Admission: RE | Admit: 2023-02-17 | Discharge: 2023-02-17 | Disposition: A | Discharge status: Home / Self Care | Payer: Commercial Managed Care - PPO | Source: Ambulatory Visit | Attending: Family Medicine | Admitting: Family Medicine

## 2023-02-17 DIAGNOSIS — Z1231 Encounter for screening mammogram for malignant neoplasm of breast: Secondary | ICD-10-CM | POA: Diagnosis not present

## 2023-02-18 ENCOUNTER — Other Ambulatory Visit: Payer: Self-pay

## 2023-02-18 ENCOUNTER — Ambulatory Visit: Payer: Commercial Managed Care - PPO | Admitting: Internal Medicine

## 2023-02-18 ENCOUNTER — Encounter: Payer: Self-pay | Admitting: Internal Medicine

## 2023-02-18 ENCOUNTER — Other Ambulatory Visit (HOSPITAL_COMMUNITY): Payer: Self-pay

## 2023-02-18 VITALS — BP 102/64 | HR 78 | Ht 63.0 in | Wt 130.2 lb

## 2023-02-18 DIAGNOSIS — E559 Vitamin D deficiency, unspecified: Secondary | ICD-10-CM | POA: Diagnosis not present

## 2023-02-18 DIAGNOSIS — M859 Disorder of bone density and structure, unspecified: Secondary | ICD-10-CM | POA: Diagnosis not present

## 2023-02-18 DIAGNOSIS — E038 Other specified hypothyroidism: Secondary | ICD-10-CM

## 2023-02-18 DIAGNOSIS — E893 Postprocedural hypopituitarism: Secondary | ICD-10-CM

## 2023-02-18 DIAGNOSIS — R7303 Prediabetes: Secondary | ICD-10-CM

## 2023-02-18 LAB — HEMOGLOBIN A1C: Hgb A1c MFr Bld: 5.9 % (ref 4.6–6.5)

## 2023-02-18 LAB — VITAMIN D 25 HYDROXY (VIT D DEFICIENCY, FRACTURES): VITD: 52.2 ng/mL (ref 30.00–100.00)

## 2023-02-18 LAB — T4, FREE: Free T4: 1.02 ng/dL (ref 0.60–1.60)

## 2023-02-18 MED ORDER — HYDROCORTISONE SOD SUC (PF) 100 MG IJ SOLR: INTRAMUSCULAR | Status: DC

## 2023-02-18 MED ORDER — LEVOTHYROXINE SODIUM 100 MCG PO TABS
100.0000 ug | ORAL_TABLET | Freq: Every day | ORAL | 3 refills | Status: DC
  Filled 2023-02-18: qty 90, 90d supply, fill #0
  Filled 2023-05-10: qty 90, 90d supply, fill #1
  Filled 2023-08-23: qty 90, 90d supply, fill #2
  Filled 2023-12-02 – 2023-12-03 (×2): qty 90, 90d supply, fill #3

## 2023-02-18 MED ORDER — HYDROCORTISONE 5 MG PO TABS
15.0000 mg | ORAL_TABLET | Freq: Every morning | ORAL | 3 refills | Status: DC
  Filled 2023-02-21 – 2023-05-10 (×2): qty 270, 90d supply, fill #0
  Filled 2023-08-23: qty 270, 90d supply, fill #1
  Filled 2023-12-02 – 2024-01-24 (×3): qty 270, 90d supply, fill #2

## 2023-02-18 NOTE — Progress Notes (Signed)
Patient ID: Marisa Galloway, female   DOB: 1980/07/01, 42 y.o.   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 She was previously seen by Dr Talmage Nap. Last visit with me 1 year ago.  Interim history: She denies persistent fatigue, nausea, headaches, vision changes. After last visit, she requested a referral to fertility specialist - she saw [place].  She did not go ahead with follicular harvesting yet. She did not exercise much since last visit, as she was busy taking care of her father who has dementia.  However, in the new year, she plans to restart exercise and focus more on herself.  Reviewed history: Pt. has been dx with a nonsecreting pituitary adenoma in 04/2011 >> noticed a change in vision >> changed glasses >> vision still deteriorating >> VF abnormal >> MRI pituitary >> pituitary tumor >> 05/2011 TSR >> 09/2011 craniotomy by Dr Newell Coral as there was still remnant pituitary tumor in the sella. 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 She had VF checked after the surgery >> normalized.   10/10/2015: Eye and visual field exam: - Vision 20/20 both eyes  - IOP 15 both eyes  - Visual fields: Complete resolution of bitemporal hemianopsia!  Pituitary MRI (01/26/2016): No change from the prior study. Postop pituitary resection. No residual pituitary tissue identified. Infundibulum not identified. No significant abnormality in the remainder of the brain.  04/2018: VF normal 04/2019: VF normal reportedly  Pituitary MRI (11/15/2019): No residual pituitary tissue identified or other suspicious masses  Pituitary MRI (03/03/2022): Unchanged empty sella without evidence of recurrent pituitary tumor.  Central hypothyroidism: On Levothyroxine 100 mcg daily: - in am - fasting - at least 30 min from b'fast - + calcium >4h later - no iron - +  multivitamins at lunchtime - no PPIs - not on Biotin  Reviewed her thyroid tests: 02/11/2022: Free T4 0.97 (0.61-1.12), free T3 3.44 (2.5-3.9) Lab Results  Component Value Date   FREET4 0.91 02/12/2021   FREET4 0.94 09/29/2020   FREET4 0.84 02/14/2020   FREET4 0.84 02/06/2019   FREET4 0.79 02/06/2018   FREET4 1.01 02/04/2017   FREET4 0.89 04/08/2016   FREET4 0.69 02/06/2016   FREET4 0.80 02/06/2015   FREET4 0.85 01/22/2014  Per records from Dr Talmage Nap: 12/23/2011: free t4: 1.08 Lab Results  Component Value Date   T3FREE 2.8 02/12/2021   T3FREE 3.7 09/29/2020   T3FREE 3.2 02/14/2020   T3FREE 3.6 02/06/2019   T3FREE 3.2 02/06/2018   T3FREE 4.2 02/04/2017   T3FREE 3.0 04/08/2016   T3FREE 4.0 02/06/2016   Central adrenal insufficiency On hydrocortisone -10 mg in a.m. and 5 mg in p.m. -She did not have to use higher doses of cortisone or parenteral steroid solution since last visit.  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 Recent levels were normal: Lab Results  Component Value Date   PROLACTIN 21.8 02/06/2018   PROLACTIN 18.2 02/06/2016   PROLACTIN 24.9 04/18/2015   PROLACTIN 20.9 02/06/2015   PROLACTIN 0.7 01/22/2014   PROLACTIN 41.6 05/17/2011  04/11/2013: Prolactin 0.7 12/20/2012: Prolactin <0.1 12/23/2011: Prolactin 42.5  GH deficiency She is not on growth hormone 07/03/2013: IGF-I 34 (73-243) 06/21/2012:  IGF-I 34 (73-243) 12/23/2011 IGF-I 19 (77-271)  She also has low BMD for age: 84/26/2021 Lumbar spine L1-L4 Femoral neck (FN)  Z-score -0.2 RFN: -1.8 LFN: -2.2  Change in BMD from previous DXA test (%)  +3.2%* -0.7%  (*) statistically significant   02/16/2017 Lumbar spine L1-L4 Femoral neck (FN)  Z-score -0.5 RFN:  -1.9 LFN: -2.2  Change in BMD from previous DXA test (%) Down 0.9% Up 1.7%  (*) statistically significant    Lumbar spine (L1-L4) Z score Femoral neck (FN) Z score   02/18/2015 ([facility])  -0.3   RFN: -1.8 LFN: -2.3   06/21/2012  -0.7 RFN: -2.1 LFN: -2.0   She continues on calcium carbonate + vitamin D (600 mg +400 units) once a day + 1000 units vitamin D daily.  Her vitamin D levels have been normal: Lab Results  Component Value Date   VD25OH 57.46 02/17/2022   VD25OH 36.21 02/12/2021   VD25OH 51.4 09/29/2020   VD25OH 19.9 (L) 02/14/2020   VD25OH 37.37 02/06/2019   VD25OH 53.53 02/06/2018   VD25OH 55.96 02/04/2017   VD25OH 59.36 02/06/2016   VD25OH 57.41 04/18/2015   She continues to exercise regularly. She had a stress fracture in the past but this healed she was able to restart exercise.  She was running between 15 to 25 mi a week, but decreased the amount of exercise afterwards due to being busy at work.  HbA1c is low in the prediabetic range: Lab Results  Component Value Date   HGBA1C 6.0 02/17/2022   HGBA1C 5.9 02/12/2021   HGBA1C 5.8 02/14/2020   HGBA1C 5.7 02/06/2019   HGBA1C 5.8 02/06/2018   HGBA1C 5.7 02/04/2017   HGBA1C 5.7 02/06/2016   HGBA1C 5.8 02/06/2015   At previous visits, she was drinking more regular sodas as she did not drink coffee or tea.  She reduced these afterwards and alternating regular with diet sodas >> no regular sodas now.  ROS: + see HPI  I reviewed pt's medications, allergies, PMH, social hx, family hx, and changes were documented in the history of present illness. Otherwise, unchanged from my initial visit note.  Past Medical History:  Diagnosis Date   Headache(784.0)    occasionally   Hypopituitarism after adenoma resection (HCC) 01/21/2014   Pituitary tumor    Seasonal allergies    doesn't require meds   Vision changes    bilateral hemianopsia   Past Surgical History:  Procedure Laterality Date   BRAIN SURGERY      CRANIOTOMY  05/18/2011   Procedure: CRANIOTOMY HYPOPHYSECTOMY TRANSNASAL APPROACH;  Surgeon: Hewitt Shorts, MD;  Location: [facility];  Service: Neurosurgery;  Laterality: N/A;  Transphenoidal resection of pituitary tumor with Dr Annalee Genta Fat graft from abdomen   CRANIOTOMY  09/29/2011   Procedure: CRANIOTOMY TUMOR EXCISION;  Surgeon: Hewitt Shorts, MD;  Location: [facility];  Service: Neurosurgery;  Laterality: N/A;  Craniotomy for resection of pituitary tumor   NASAL SINUS SURGERY  05/18/2011   Procedure: ENDOSCOPIC SINUS SURGERY WITH STEALTH;  Surgeon: Barbee Cough, MD;  Location: [facility];  Service: ENT;  Laterality: N/A;  Opening for Transphenoidal for pituitary tumor   SALIVARY GLAND SURGERY  08/09/2007   History   Social History   Marital Status: Single    Spouse Name: N/A    Number of Children: 0   Occupational History   RN - Interior and spatial designer [employer]   Social History Main Topics   Smoking status: Never Smoker  Smokeless tobacco: Not on file   Alcohol Use: Yes     Comment: rare glass of wine   Drug Use: No   Sexual Activity: Yes    Birth Control/ Protection: Pill   Current Outpatient Medications on File Prior to Visit  Medication Sig Dispense Refill   Calcium Carb-Cholecalciferol (CALCIUM-VITAMIN D) 600-400 MG-UNIT TABS Take 1 tablet by mouth daily.     Cholecalciferol (VITAMIN D PO) Take 1,000 Units by mouth.     drospirenone-ethinyl estradiol (YAZ,GIANVI,LORYNA) 3-0.02 MG tablet Take 1 tablet by mouth daily.     hydrocortisone (CORTEF) 5 MG tablet Take 2 tablets by mouth every morning and 1 tablet every morning. 300 tablet 1   levothyroxine (SYNTHROID) 100 MCG tablet Take 1 tablet (100 mcg total) by mouth daily before breakfast. 90 tablet 3   Multiple Vitamin (MULTIVITAMIN) tablet Take 1 tablet by mouth daily.     promethazine-dextromethorphan (PROMETHAZINE-DM) 6.25-15 MG/5ML syrup Take 5 mls by mouth every 6 hours as needed for cough. 120 mL 0   Syringe/Needle,  Disp, (SYRINGE 3CC/25GX1") 25G X 1" 3 ML MISC Inject in the muscle as needed - use for solucortef 50 each prn   No current facility-administered medications on file prior to visit.    No Known Allergies   No family history on file.  PE: BP 102/64   Pulse 78   Ht 5\' 3"  (1.6 m)   Wt 130 lb 3.2 oz (59.1 kg)   SpO2 99%   BMI 23.06 kg/m   Wt Readings from Last 3 Encounters:  02/18/23 130 lb 3.2 oz (59.1 kg)  02/17/22 133 lb 3.2 oz (60.4 kg)  02/12/21 127 lb 6.4 oz (57.8 kg)   Constitutional: normal weight, in NAD Eyes:  EOMI, no exophthalmos ENT: no neck masses, no cervical lymphadenopathy Cardiovascular: RRR, No MRG Respiratory: CTA B Musculoskeletal: no deformities Skin:no rashes Neurological: no tremor with outstretched hands  ASSESSMENT: 1. Hypopituitarism - Visual field normalized: VF from 10/10/2015: Complete resolution of bitemporal hemianopsia!  2. Low bone mineral density for age  70. Prediabetes  PLAN:  1. Hypopituitarism -Reviewed the report of her MRI from 11/15/2019: No recurrences of her previous pituitary tumor a visual field was normal in 04/2019.  No further visual fields are needed for her. Latest pituitary MRI, which was checked 02/2022 (ordered by Dr. Lewie Chamber) showed empty sella, unchanged and decreased sinusitis A. Central hypothyroidism - Latest free T4 was excellent on 02/11/2022. - she continues levothyroxine 100 mcg daily - pt feels good on this dose. - we discussed about taking the thyroid hormone every day, with water, >30 minutes before breakfast, separated by >4 hours from acid reflux medications, calcium, iron, multivitamins. Pt. is taking it correctly. - will check her free T4 today  B. Central adrenal insufficiency -She is on hydrocortisone 10 mg in a.m. and 5 mg in p.m. -No adrenal insufficiency symptoms at today's visit.  No significant fatigue, nausea, abdominal pain, weight loss, headaches. -She not have to apply sick day rules since  last visit: If you cannot keep anything down, including your hydrocortisone medication, please go to the emergency room or your primary care physician office to get steroids injected in the muscle or vein. If you have a fever (more than 100 Fahrenheit), please double the dose of your hydrocortisone for the duration of the fever. Do not run out of your hydrocortisone medication. -She has a med alert bracelet mentioning adrenal insufficiency  C. Hypogonadotropic hypogonadism -On [facility] Helen Hashimoto) -Sees Dr. Billy Coast -  No immediate plans on pregnancy but at last visit she is contemplating follicular harvesting  D. GH insufficiency -She has a low IGF-I -At previous visits, we did discuss about the indication for growth hormone treatment and possible benefits/side effects. She decided to forego starting this, especially since the cardiovascular benefits of replacement are not clearly defined and the fact that it is a daily injectable medicine.  E. No signs of DI.  -No increased thirst or urination   2. Low bone mineral density -Reviewed the latest bone density report from 08/2019: Her Z-score increased in spine and he was stable at the hips -Plan to repeat another bone density scan in 5 years from the previous, which will be in 2 years -She had a low vitamin D in 2021 (19.9) so I advised her to double up on the dose (she forgot to do so, but did start to take the vitamin D consistently) >> at last visit vitamin D level was normal, at 57.46.   -She is inquiring about vitamin K2, and we discussed that she can definitely use this, but I do not feel this is absolutely mandatory for her for now -Recheck the vitamin D level today -She takes calcium daily -Stopped exercising since last visit but plans to restart  3.  Prediabetes -Mild-HbA1c levels have been in the low range of prediabetes, with the latest level being slightly higher: Lab Results  Component Value Date   HGBA1C 6.0 02/17/2022  -We will  recheck this today before last visit she stopped diet sodas.  She previously stopped regular sodas. -She was less active since last visit, she was busy with her father -Will recheck HbA1c today.  Needs refills of LT4.  Component     Latest Ref Rng 02/18/2023  T4,Free(Direct)     0.60 - 1.60 ng/dL 1.61   VITD     09.60 - 100.00 ng/mL 52.20   Hemoglobin A1C     4.6 - 6.5 % 5.9   fT4 and vit D are at goal, HbA1c is lower.  Carlus Pavlov, MD PhD [facility]

## 2023-02-18 NOTE — Patient Instructions (Signed)
Please stop at the lab.  Please continue levothyroxine 100 mcg daily.  Take the thyroid hormone every day, with water, at least 30 minutes before breakfast, separated by at least 4 hours from: - acid reflux medications - calcium - iron - multivitamins  Please continue hydrocortisone 10 mg in a.m. and 5 mg in p.m. (no later than 6 PM).  - You absolutely need to take this medication every day and not skip doses. - Please double the dose if you have a fever, for the duration of the fever. - If you cannot take anything by mouth (vomiting) or you have severe diarrhea so that you eliminate the hydrocortisone pills in your stool, please make sure that you get the hydrocortisone in the vein instead - go to the nearest emergency department/urgent care or you may go to your PCPs office   Please return in 1 year.

## 2023-02-21 ENCOUNTER — Other Ambulatory Visit (HOSPITAL_COMMUNITY)
Admission: RE | Admit: 2023-02-21 | Discharge: 2023-02-21 | Disposition: A | Discharge status: Home / Self Care | Payer: Commercial Managed Care - PPO | Source: Ambulatory Visit | Attending: Oncology | Admitting: Oncology

## 2023-02-21 ENCOUNTER — Other Ambulatory Visit (HOSPITAL_COMMUNITY): Payer: Self-pay

## 2023-02-21 DIAGNOSIS — Z006 Encounter for examination for normal comparison and control in clinical research program: Secondary | ICD-10-CM | POA: Insufficient documentation

## 2023-02-22 ENCOUNTER — Other Ambulatory Visit (HOSPITAL_COMMUNITY): Payer: Self-pay

## 2023-03-01 LAB — GENECONNECT MOLECULAR SCREEN: Genetic Analysis Overall Interpretation: NEGATIVE

## 2023-03-02 ENCOUNTER — Other Ambulatory Visit (HOSPITAL_COMMUNITY): Payer: Self-pay

## 2023-05-10 ENCOUNTER — Other Ambulatory Visit (HOSPITAL_COMMUNITY): Payer: Self-pay

## 2023-05-10 ENCOUNTER — Other Ambulatory Visit: Payer: Self-pay

## 2023-06-09 DIAGNOSIS — H6123 Impacted cerumen, bilateral: Secondary | ICD-10-CM | POA: Diagnosis not present

## 2023-06-19 ENCOUNTER — Other Ambulatory Visit: Payer: Self-pay | Admitting: Internal Medicine

## 2023-06-20 DIAGNOSIS — Z01411 Encounter for gynecological examination (general) (routine) with abnormal findings: Secondary | ICD-10-CM | POA: Diagnosis not present

## 2023-06-20 DIAGNOSIS — Z1331 Encounter for screening for depression: Secondary | ICD-10-CM | POA: Diagnosis not present

## 2023-06-20 DIAGNOSIS — Z124 Encounter for screening for malignant neoplasm of cervix: Secondary | ICD-10-CM | POA: Diagnosis not present

## 2023-06-20 DIAGNOSIS — Z01419 Encounter for gynecological examination (general) (routine) without abnormal findings: Secondary | ICD-10-CM | POA: Diagnosis not present

## 2023-06-20 DIAGNOSIS — Z113 Encounter for screening for infections with a predominantly sexual mode of transmission: Secondary | ICD-10-CM | POA: Diagnosis not present

## 2023-06-23 MED ORDER — HYDROCORTISONE SOD SUC (PF) 100 MG IJ SOLR: INTRAMUSCULAR | 3 refills | Status: DC

## 2023-08-17 DIAGNOSIS — K760 Fatty (change of) liver, not elsewhere classified: Secondary | ICD-10-CM | POA: Diagnosis not present

## 2023-08-17 DIAGNOSIS — Z0189 Encounter for other specified special examinations: Secondary | ICD-10-CM | POA: Diagnosis not present

## 2023-08-17 DIAGNOSIS — E893 Postprocedural hypopituitarism: Secondary | ICD-10-CM | POA: Diagnosis not present

## 2023-08-17 DIAGNOSIS — Z13220 Encounter for screening for lipoid disorders: Secondary | ICD-10-CM | POA: Diagnosis not present

## 2023-08-17 DIAGNOSIS — Z86011 Personal history of benign neoplasm of the brain: Secondary | ICD-10-CM | POA: Diagnosis not present

## 2023-09-01 ENCOUNTER — Telehealth: Payer: Self-pay | Admitting: Hematology

## 2023-09-01 NOTE — Telephone Encounter (Signed)
 Scheduled new heme appointment with the patient per the providers request. The patient is aware of the changes made and is active on MyChart.

## 2023-09-07 NOTE — Progress Notes (Signed)
 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   Patient was noted to have borderline elevated lymphocytes on routine labs with her PCP, Dr. Ivey Marlin. Her lymphocytes were noted to be 6.10 in November 2023 and stable at 6.20 on labs from May 2025.   Patient is accompanied by her mother during today's visit. She reports that she feels well overall over the last 6-12 months.   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   She is on chronic steroid replacement and birth control and thyroid replacement.   She is only on a physiologic dose of steroids, which is not necessarily immunosuppressive   Patient also noted to have pmhx of fatty liver. She has no other chronic medical issues.   Patient reports that she may have had COVID-19 infection in 2024.   She reports that she possibly had the flu in February 2025, though it was not confirmed on testing. Her symptoms included fever for several days along with diarrhea. She notes that she lost weight at that time. Patient did not have any respiratory symptoms.   She reports occasional hip pain and notes that previous bone density scan shows mild arthritis.  Patient denies any leg swelling, change in bowel habits, change in breathing, or change in urinary symptoms.   Patient reports that she has been on thyroid replacement since 2013. She reports that her thyroid replacement was increased some time, possibly in the last year, but she is unsure when.    She has never received blood transfusions in  the past.   Patient notes that she has been stuck by a needle in the past but denies any concerns related to fluid exposures.   Patient reports that she has traveled outside of the [place]  in the past and denies any illness issues during that time.   Patient denies any new symptoms, sudden change in weight, fever, chills, or night sweats.   She denies any Fhx of autoimmune conditions. Patient denies any other Fhx of chronic medical issues.Patient notes that she is unsure of her fhx on her father's side.   She reports that she has never smoked and denies vaping. Patient reports only social use of alcohol, 1-2x a month.   Patient denies any unusual supplemental intake or other supplements.   She reports no medication allergies.   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   Pituitary tumor    Seasonal allergies    doesn't require meds   Vision changes    bilateral hemianopsia     SURGICAL HISTORY: Past Surgical History:  Procedure Laterality Date   BRAIN SURGERY     CRANIOTOMY  05/18/2011   Procedure: CRANIOTOMY HYPOPHYSECTOMY TRANSNASAL APPROACH;  Surgeon: Corrina Dimitri, MD;  Location: [facility];  Service: Neurosurgery;  Laterality: N/A;  Transphenoidal resection of pituitary tumor with Dr Melissa Spring Fat graft from abdomen   CRANIOTOMY  09/29/2011   Procedure: CRANIOTOMY TUMOR EXCISION;  Surgeon: Corrina Dimitri, MD;  Location: [facility];  Service: Neurosurgery;  Laterality: N/A;  Craniotomy for resection of pituitary tumor   NASAL SINUS SURGERY  05/18/2011   Procedure: ENDOSCOPIC SINUS SURGERY WITH STEALTH;  Surgeon: Baxter Limber, MD;  Location: [facility];  Service: ENT;  Laterality: N/A;  Opening for Transphenoidal for pituitary tumor   SALIVARY GLAND SURGERY  08/09/2007    SOCIAL HISTORY: Social History   Socioeconomic History   Marital  status: Single    Spouse name: Not on file   Number of children: Not on file   Years of education: Not on file   Highest education level: Not on file  Occupational History   Not on file  Tobacco Use   Smoking status: Never   Smokeless tobacco: Never  Substance and Sexual Activity   Alcohol use: Yes    Comment: rare glass of wine   Drug use: No   Sexual activity: Yes    Birth control/protection: Pill  Other Topics Concern   Not on file  Social History Narrative   Not on file   Social Drivers of Health   Financial Resource Strain: Not on file  Food Insecurity: No Food Insecurity (09/08/2023)   Hunger Vital Sign    Worried About Running Out of Food in the Last Year: Never true    Ran Out of Food in the Last Year: Never true  Transportation Needs: No Transportation Needs (09/08/2023)   PRAPARE - Administrator, Civil Service (Medical): No    Lack of Transportation (Non-Medical): No  Physical Activity: Not on file  Stress: Not on file  Social Connections: Not on file  Intimate Partner Violence: Not At Risk (09/08/2023)   Humiliation, Afraid, Rape, and Kick questionnaire    Fear of Current or Ex-Partner: No    Emotionally Abused: No    Physically Abused: No    Sexually Abused: No    FAMILY HISTORY: No family history on file.  ALLERGIES:  has no known allergies.    MEDICATIONS:  Current Outpatient Medications  Medication Sig Dispense Refill   Calcium  Carb-Cholecalciferol (CALCIUM -VITAMIN D ) 600-400 MG-UNIT TABS Take 1 tablet by mouth daily.     Cholecalciferol (VITAMIN D  PO) Take 1,000 Units by mouth.     drospirenone -ethinyl estradiol  (YAZ,GIANVI,LORYNA ) 3-0.02 MG tablet Take 1 tablet by mouth daily.     hydrocortisone  (CORTEF ) 5 MG tablet Take 2 tablets by mouth every morning and 1 tablet every morning. 300 tablet 3   hydrocortisone  sodium succinate (SOLU-CORTEF ) 100 MG injection Inject 100 mg as needed under skin 1 each 3   levothyroxine  (SYNTHROID ) 100 MCG  tablet Take 1 tablet (100 mcg total) by mouth daily before breakfast. 90 tablet 3   Multiple Vitamin (MULTIVITAMIN) tablet Take 1 tablet by mouth daily.     promethazine -dextromethorphan (PROMETHAZINE -DM) 6.25-15 MG/5ML syrup Take 5 mls by mouth every 6 hours as needed for cough. (Patient not taking: Reported on 09/08/2023) 120 mL 0   Syringe/Needle, Disp, (SYRINGE 3CC/25GX1) 25G X 1 3 ML MISC Inject in the muscle as needed - use for solucortef 50 each prn   No current facility-administered medications for this visit.    REVIEW OF SYSTEMS:    10 Point review of Systems was done is negative except as noted above.  PHYSICAL EXAMINATION: ECOG PERFORMANCE STATUS: 0 - Asymptomatic  . Vitals:   09/08/23 1007  BP: 113/87  Pulse: 81  Resp: 16  Temp: 97.6 F (36.4 C)  SpO2: 100%   Filed Weights   09/08/23  1007  Weight: 127 lb 4 oz (57.7 kg)   .Body mass index is 22.54 kg/m.  GENERAL:alert, in no acute distress and comfortable SKIN: no acute rashes, no significant lesions EYES: conjunctiva are pink and non-injected, sclera anicteric OROPHARYNX: MMM, no exudates, no oropharyngeal erythema or ulceration NECK: supple, no JVD LYMPH:  no palpable lymphadenopathy in the cervical, axillary or inguinal regions LUNGS: clear to auscultation b/l with normal respiratory effort HEART: regular rate & rhythm ABDOMEN:  normoactive bowel sounds , non tender, not distended. No palpable heaptosplenomegaly Extremity: no pedal edema PSYCH: alert & oriented x 3 with fluent speech NEURO: no focal motor/sensory deficits  LABORATORY DATA:  I have reviewed the data as listed  .    Latest Ref Rng & Units 09/08/2023   11:35 AM 09/30/2011    4:10 AM 09/23/2011   10:19 AM  CBC  WBC 4.0 - 10.5 K/uL 9.6  17.7  8.0   Hemoglobin 12.0 - 15.0 g/dL 16.1  09.6  04.5   Hematocrit 36.0 - 46.0 % 40.7  34.7  40.7   Platelets 150 - 400 K/uL 288  288  308    08/17/2023 CBC with differential:    . 02/11/2022 CBC  with differential:       Latest Ref Rng & Units 09/08/2023   11:35 AM 01/22/2014    8:18 AM 10/06/2011    4:34 AM  CMP  Glucose 70 - 99 mg/dL 65  76  73   BUN 6 - 20 mg/dL 7  12  9    Creatinine 0.44 - 1.00 mg/dL 4.09  1.0  8.11   Sodium 135 - 145 mmol/L 136  138  139   Potassium 3.5 - 5.1 mmol/L 3.5  3.6  3.8   Chloride 98 - 111 mmol/L 104  103  99   CO2 22 - 32 mmol/L 25  18  28    Calcium  8.9 - 10.3 mg/dL 8.8  9.5  9.0   Total Protein 6.5 - 8.1 g/dL 7.5  7.8    Total Bilirubin 0.0 - 1.2 mg/dL 0.6  0.4    Alkaline Phos 38 - 126 U/L 56  58    AST 15 - 41 U/L 18  50    ALT 0 - 44 U/L 14  59     08/17/2023 CMP:     08/16/2022 CMP:    02/11/2022 CMP:    02/11/2022 thyroid testing:         RADIOGRAPHIC STUDIES: I have personally reviewed the radiological images as listed and agreed with the findings in the report. No results found.  ASSESSMENT & PLAN:   43 y.o. 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 Hx of viral respiratory infections in feb2025 and late part of 2024. ?reactive vs clonal lymphocytosis.  PLAN:  -CBC from 08/17/2023 shows WBCs 9.3K, lymphocytes 6.20K, HGB 14.1, and PLT 370K, -her lymphocytes were notes to be elevated at 6.10K in November 2023 -did not feel any enlarged spleen or enlarged lymph nodes during physical examination -educated patient of general potential processes that could cause reactive lymphocytosis such as chronic inflammation, passing viral infection, EBV, change in distribution of cells from hormonal changes -discussed that if her steroids are under-replaced, this can slightly elevate lymphocytes. Also discussed that lymphocytosis can also occur with inappropriate replacement of thyroid.  -discussed that her respiratory infections over the last year could have caused elevated lymphocytes  -discussed that if her lymphocytosis is reactive, there  may only be a need for monitoring  without treatment for lymphocytosis -will order labs today  -will order flow cytometry to evaluate whether her lymphocytosis is reactive or clonal -discussed that in the absence of enlarged lymph nodes/spleen on physical examination, if her flow cytometry is negative, it is likely that her mild lymphocytosis is related to hormonal fluctuations causing changes in the distribution of cells.   . Orders Placed This Encounter  Procedures   CBC with Differential (Cancer Center Only)    Standing Status:   Future    Number of Occurrences:   1    Expiration Date:   09/07/2024   CMP (Cancer Center only)    Standing Status:   Future    Number of Occurrences:   1    Expected Date:   09/08/2023    Expiration Date:   09/07/2024   Flow Cytometry, Peripheral Blood (Oncology)    Persistent flow cytometry ?lymphoproliferative disorder    Standing Status:   Future    Number of Occurrences:   1    Expected Date:   09/08/2023    Expiration Date:   09/07/2024   Lactate dehydrogenase    Standing Status:   Future    Number of Occurrences:   1    Expected Date:   09/08/2023    Expiration Date:   09/07/2024   Sedimentation rate    Standing Status:   Future    Number of Occurrences:   1    Expected Date:   09/08/2023    Expiration Date:   09/07/2024   Angiotensin converting enzyme    Standing Status:   Future    Number of Occurrences:   1    Expected Date:   09/08/2023    Expiration Date:   09/07/2024   HIV Antibody (routine testing w rflx)    Standing Status:   Future    Number of Occurrences:   1    Expected Date:   09/08/2023    Expiration Date:   09/07/2024   RPR    Standing Status:   Future    Number of Occurrences:   1    Expected Date:   09/08/2023    Expiration Date:   09/07/2024   Hepatitis C antibody    Standing Status:   Future    Number of Occurrences:   1    Expected Date:   09/08/2023    Expiration Date:   09/07/2024   TSH    Standing Status:   Future    Number of Occurrences:   1    Expiration Date:    09/07/2024   T4, free    Standing Status:   Future    Number of Occurrences:   1    Expiration Date:   09/07/2024    FOLLOW-UP: Labs today Phone visit with Dr Salomon Cree in 1 week  The total time spent in the appointment was 45 minutes* .  All of the patient's questions were answered with apparent satisfaction. The patient knows to call the clinic with any problems, questions or concerns.   Jacquelyn Matt MD MS AAHIVMS [facility] Hematology/Oncology Physician [facility]  .*Total Encounter Time as defined by the Centers for Medicare and Medicaid Services includes, in addition to the face-to-face time of a patient visit (documented in the note above) non-face-to-face time: obtaining and reviewing outside history, ordering and reviewing medications, tests or procedures, care coordination (communications with other health care professionals or caregivers)  and documentation in the medical record.    I,Mitra Faeizi,acting as a Neurosurgeon for Jacquelyn Matt, MD.,have documented all relevant documentation on the behalf of Jacquelyn Matt, MD,as directed by  Jacquelyn Matt, MD while in the presence of Jacquelyn Matt, MD.  .I have reviewed the above documentation for accuracy and completeness, and I agree with the above. .Takiera Mayo Kishore Kasai Beltran MD

## 2023-09-08 ENCOUNTER — Inpatient Hospital Stay

## 2023-09-08 ENCOUNTER — Inpatient Hospital Stay: Attending: Hematology | Admitting: Hematology

## 2023-09-08 VITALS — BP 113/87 | HR 81 | Temp 97.6°F | Resp 16 | Ht 63.0 in | Wt 127.2 lb

## 2023-09-08 DIAGNOSIS — Z79890 Hormone replacement therapy (postmenopausal): Secondary | ICD-10-CM | POA: Diagnosis not present

## 2023-09-08 DIAGNOSIS — D72820 Lymphocytosis (symptomatic): Secondary | ICD-10-CM | POA: Insufficient documentation

## 2023-09-08 DIAGNOSIS — E230 Hypopituitarism: Secondary | ICD-10-CM | POA: Insufficient documentation

## 2023-09-08 LAB — CBC WITH DIFFERENTIAL (CANCER CENTER ONLY)
Abs Immature Granulocytes: 0.01 10*3/uL (ref 0.00–0.07)
Basophils Absolute: 0.1 10*3/uL (ref 0.0–0.1)
Basophils Relative: 1 %
Eosinophils Absolute: 0.2 10*3/uL (ref 0.0–0.5)
Eosinophils Relative: 2 %
HCT: 40.7 % (ref 36.0–46.0)
Hemoglobin: 13.6 g/dL (ref 12.0–15.0)
Immature Granulocytes: 0 %
Lymphocytes Relative: 70 %
Lymphs Abs: 6.7 10*3/uL — ABNORMAL HIGH (ref 0.7–4.0)
MCH: 28.8 pg (ref 26.0–34.0)
MCHC: 33.4 g/dL (ref 30.0–36.0)
MCV: 86 fL (ref 80.0–100.0)
Monocytes Absolute: 0.4 10*3/uL (ref 0.1–1.0)
Monocytes Relative: 4 %
Neutro Abs: 2.2 10*3/uL (ref 1.7–7.7)
Neutrophils Relative %: 23 %
Platelet Count: 288 10*3/uL (ref 150–400)
RBC: 4.73 MIL/uL (ref 3.87–5.11)
RDW: 12.6 % (ref 11.5–15.5)
WBC Count: 9.6 10*3/uL (ref 4.0–10.5)
nRBC: 0 % (ref 0.0–0.2)

## 2023-09-08 LAB — T4, FREE: Free T4: 1.05 ng/dL (ref 0.61–1.12)

## 2023-09-08 LAB — CMP (CANCER CENTER ONLY)
ALT: 14 U/L (ref 0–44)
AST: 18 U/L (ref 15–41)
Albumin: 4.3 g/dL (ref 3.5–5.0)
Alkaline Phosphatase: 56 U/L (ref 38–126)
Anion gap: 7 (ref 5–15)
BUN: 7 mg/dL (ref 6–20)
CO2: 25 mmol/L (ref 22–32)
Calcium: 8.8 mg/dL — ABNORMAL LOW (ref 8.9–10.3)
Chloride: 104 mmol/L (ref 98–111)
Creatinine: 0.71 mg/dL (ref 0.44–1.00)
GFR, Estimated: >60 mL/min (ref >60)
Glucose, Bld: 65 mg/dL — ABNORMAL LOW (ref 70–99)
Potassium: 3.5 mmol/L (ref 3.5–5.1)
Sodium: 136 mmol/L (ref 135–145)
Total Bilirubin: 0.6 mg/dL (ref 0.0–1.2)
Total Protein: 7.5 g/dL (ref 6.5–8.1)

## 2023-09-08 LAB — SEDIMENTATION RATE: Sed Rate: 4 mm/h (ref 0–22)

## 2023-09-08 LAB — HEPATITIS C ANTIBODY: HCV Ab: NONREACTIVE

## 2023-09-08 LAB — LACTATE DEHYDROGENASE: LDH: 119 U/L (ref 98–192)

## 2023-09-08 LAB — TSH: TSH: <0.01 u[IU]/mL — ABNORMAL LOW (ref 0.350–4.500)

## 2023-09-08 LAB — HIV ANTIBODY (ROUTINE TESTING W REFLEX): HIV Screen 4th Generation wRfx: NONREACTIVE

## 2023-09-09 LAB — RPR: RPR Ser Ql: NONREACTIVE

## 2023-09-09 LAB — ANGIOTENSIN CONVERTING ENZYME: Angiotensin-Converting Enzyme: 52 U/L (ref 14–82)

## 2023-09-09 LAB — SURGICAL PATHOLOGY

## 2023-09-12 LAB — FLOW CYTOMETRY

## 2023-09-25 NOTE — Progress Notes (Signed)
 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   Patient was noted to have borderline elevated lymphocytes on routine labs with her PCP, Dr. Leonel. Her lymphocytes were noted to be 6.10 in November 2023 and stable at 6.20 on labs from May 2025.   Patient is accompanied by her mother during today's visit. She reports that she feels well overall over the last 6-12 months.   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   She is on chronic steroid replacement and birth control and thyroid  replacement.   She is only on a physiologic dose of steroids, which is not necessarily immunosuppressive   Patient also noted to have pmhx of fatty liver. She has no other chronic medical issues.   Patient reports that she may have had COVID-19 infection in 2024.   She reports that she possibly had the flu in February 2025, though it was not confirmed on testing. Her symptoms included fever for several days along with diarrhea. She notes that she lost weight at that time. Patient did not have any respiratory symptoms.   She reports occasional hip pain and notes that previous bone density scan shows mild arthritis.  Patient denies any leg swelling, change in bowel habits, change in breathing, or change in urinary symptoms.   Patient reports that she has been on thyroid  replacement since 2013. She reports that her thyroid  replacement was increased some time, possibly in the last year, but she is unsure when.    She has never received blood transfusions in  the past.   Patient notes that she has been stuck by a needle in the past but denies any concerns related to fluid exposures.   Patient reports that she has traveled outside of the [place]  in the past and denies any illness issues during that time.   Patient denies any new symptoms, sudden change in weight, fever, chills, or night sweats.   She denies any Fhx of autoimmune conditions. Patient denies any other Fhx of chronic medical issues.Patient notes that she is unsure of her fhx on her father's side.   She reports that she has never smoked and denies vaping. Patient reports only social use of alcohol, 1-2x a month.   Patient denies any unusual supplemental intake or other supplements.   She reports no medication allergies.   INTERVAL HISTORY:  Marisa Galloway is a 43 y.o. female who is being connected with via telemedicine visit for continued evaluation and management of lymphocytosis.   She was initially seen by me on 09/08/2023 and complained of occasional hip pain related to mild arthritis, possible flu in February 2025, and COVID-19 infection in 2024.   I connected with Marisa Galloway on 09/26/2023 at  3:30 PM EDT by telephone visit and verified that I am speaking with the correct person using two identifiers.   I discussed the limitations, risks, security and privacy concerns of performing an evaluation and management service by telemedicine and the availability of in-person appointments. I also discussed with the patient that there may be a patient responsible charge related to this service.  The patient expressed understanding and agreed to proceed.   Other persons participating in the visit and their role in the encounter: none   Patient's location: home  Provider's location: [facility]   Chief Complaint: lymphocytosis    The results of her recent lab testing was discussed with her in detail.  Notes no acute new symptoms.  No fevers no chills no night sweats no new lumps or  bumps.  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   Pituitary tumor    Seasonal allergies    doesn't require meds   Vision changes    bilateral hemianopsia     SURGICAL HISTORY: Past Surgical History:  Procedure Laterality Date   BRAIN SURGERY     CRANIOTOMY  05/18/2011   Procedure: CRANIOTOMY HYPOPHYSECTOMY TRANSNASAL APPROACH;  Surgeon: Marisa LELON Peaches, MD;  Location: [facility];  Service: Neurosurgery;  Laterality: N/A;  Transphenoidal resection of pituitary tumor with Dr Marisa Galloway from abdomen   CRANIOTOMY  09/29/2011   Procedure: CRANIOTOMY TUMOR EXCISION;  Surgeon: Marisa LELON Peaches, MD;  Location: [facility];  Service: Neurosurgery;  Laterality: N/A;  Craniotomy for resection of pituitary tumor   NASAL SINUS SURGERY  05/18/2011   Procedure: ENDOSCOPIC SINUS SURGERY WITH STEALTH;  Surgeon: Marisa LITTIE Mable, MD;  Location: [facility];  Service: ENT;  Laterality: N/A;  Opening for Transphenoidal for pituitary tumor   SALIVARY GLAND SURGERY  08/09/2007    SOCIAL HISTORY: Social History   Socioeconomic History   Marital status: Single    Spouse name: Not on file   Number of children: Not on file   Years of education: Not on file   Highest education level: Not on file  Occupational History   Not on file  Tobacco Use   Smoking status: Never   Smokeless tobacco: Never  Substance and Sexual Activity   Alcohol use: Yes    Comment: rare glass of wine   Drug use: No   Sexual activity: Yes    Birth control/protection: Pill  Other Topics Concern   Not on file  Social History Narrative   Not on file   Social Drivers of Health   Financial Resource Strain: Not on file  Food Insecurity: No Food Insecurity (09/08/2023)   Hunger Vital Sign    Worried About Running Out of Food in the Last Year: Never true    Ran Out of Food in the Last Year: Never true   Transportation Needs: No Transportation Needs (09/08/2023)   PRAPARE - Administrator, Civil Service (Medical): No    Lack of Transportation (Non-Medical): No  Physical Activity: Not on file  Stress: Not on file  Social Connections: Not on file  Intimate Partner Violence: Not At Risk (09/08/2023)   Humiliation, Afraid, Rape, and Kick questionnaire    Fear of Current or Ex-Partner: No    Emotionally Abused: No    Physically Abused: No    Sexually Abused: No    FAMILY HISTORY: No family history on file.  ALLERGIES:  has no known allergies.    MEDICATIONS:  Current Outpatient Medications  Medication Sig Dispense Refill   Calcium  Carb-Cholecalciferol (CALCIUM -VITAMIN D ) 600-400 MG-UNIT TABS Take 1 tablet by mouth daily.     Cholecalciferol (VITAMIN D  PO) Take 1,000 Units by mouth.     drospirenone -ethinyl estradiol  (YAZ,GIANVI,LORYNA ) 3-0.02 MG tablet Take 1 tablet by  mouth daily.     hydrocortisone  (CORTEF ) 5 MG tablet Take 2 tablets by mouth every morning and 1 tablet every morning. 300 tablet 3   hydrocortisone  sodium succinate (SOLU-CORTEF ) 100 MG injection Inject 100 mg as needed under skin 1 each 3   levothyroxine  (SYNTHROID ) 100 MCG tablet Take 1 tablet (100 mcg total) by mouth daily before breakfast. 90 tablet 3   Multiple Vitamin (MULTIVITAMIN) tablet Take 1 tablet by mouth daily.     promethazine -dextromethorphan (PROMETHAZINE -DM) 6.25-15 MG/5ML syrup Take 5 mls by mouth every 6 hours as needed for cough. (Patient not taking: Reported on 09/08/2023) 120 mL 0   Syringe/Needle, Disp, (SYRINGE 3CC/25GX1) 25G X 1 3 ML MISC Inject in the muscle as needed - use for solucortef 50 each prn   No current facility-administered medications for this visit.    REVIEW OF SYSTEMS:    10 Point review of Systems was done is negative except as noted above.  PHYSICAL EXAMINATION: telemedicine visit  LABORATORY DATA:  I have reviewed the data as listed  .    Latest Ref Rng  & Units 09/08/2023   11:35 AM 09/30/2011    4:10 AM 09/23/2011   10:19 AM  CBC  WBC 4.0 - 10.5 K/uL 9.6  17.7  8.0   Hemoglobin 12.0 - 15.0 g/dL 86.3  88.4  86.2   Hematocrit 36.0 - 46.0 % 40.7  34.7  40.7   Platelets 150 - 400 K/uL 288  288  308    .    Latest Ref Rng & Units 09/08/2023   11:35 AM 01/22/2014    8:18 AM 10/06/2011    4:34 AM  CMP  Glucose 70 - 99 mg/dL 65  76  73   BUN 6 - 20 mg/dL 7  12  9    Creatinine 0.44 - 1.00 mg/dL 9.28  1.0  9.10   Sodium 135 - 145 mmol/L 136  138  139   Potassium 3.5 - 5.1 mmol/L 3.5  3.6  3.8   Chloride 98 - 111 mmol/L 104  103  99   CO2 22 - 32 mmol/L 25  18  28    Calcium  8.9 - 10.3 mg/dL 8.8  9.5  9.0   Total Protein 6.5 - 8.1 g/dL 7.5  7.8    Total Bilirubin 0.0 - 1.2 mg/dL 0.6  0.4    Alkaline Phos 38 - 126 U/L 56  58    AST 15 - 41 U/L 18  50    ALT 0 - 44 U/L 14  59     Surgical Pathology  CASE: [id number]  PATIENT: Adalyna Bencivenga  Flow Pathology Report  Clinical history: Lymphocytosis  DIAGNOSIS:   - No abnormal B or T-cell population identified   GATING AND PHENOTYPIC ANALYSIS:   Gated population: Flow cytometric immunophenotyping is performed using  antibodies to the antigens listed in the table below. Electronic gates  are placed around a cell cluster displaying light scatter properties  corresponding to: lymphocytes   Abnormal Cells in gated population: N/A   Phenotype of Abnormal Cells: N/A   Component     Latest Ref Rng 09/08/2023  T4,Free(Direct)     0.61 - 1.12 ng/dL 8.94   TSH     9.649 - 4.500 uIU/mL <0.010 (L)   HCV Ab     NON REACTIVE  NON REACTIVE   RPR     NON REACTIVE  NON REACTIVE   HIV Screen 4th Generation wRfx  Non Reactive  Non Reactive   Angiotensin-Converting Enzyme     14 - 82 U/L 52   Sed Rate     0 - 22 mm/hr 4   LDH     98 - 192 U/L 119     Legend: (L) Low   08/17/2023 CBC with differential:    . 02/11/2022 CBC with differential:       Latest Ref Rng & Units  09/08/2023   11:35 AM 01/22/2014    8:18 AM 10/06/2011    4:34 AM  CMP  Glucose 70 - 99 mg/dL 65  76  73   BUN 6 - 20 mg/dL 7  12  9    Creatinine 0.44 - 1.00 mg/dL 9.28  1.0  9.10   Sodium 135 - 145 mmol/L 136  138  139   Potassium 3.5 - 5.1 mmol/L 3.5  3.6  3.8   Chloride 98 - 111 mmol/L 104  103  99   CO2 22 - 32 mmol/L 25  18  28    Calcium  8.9 - 10.3 mg/dL 8.8  9.5  9.0   Total Protein 6.5 - 8.1 g/dL 7.5  7.8    Total Bilirubin 0.0 - 1.2 mg/dL 0.6  0.4    Alkaline Phos 38 - 126 U/L 56  58    AST 15 - 41 U/L 18  50    ALT 0 - 44 U/L 14  59     09/08/2023 Flow Pathology Report:        08/17/2023 CMP:     08/16/2022 CMP:    02/11/2022 CMP:    02/11/2022 thyroid  testing:         RADIOGRAPHIC STUDIES: I have personally reviewed the radiological images as listed and agreed with the findings in the report. No results found.  ASSESSMENT & PLAN:   43 y.o. 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 Hx of viral respiratory infections in feb2025 and late part of 2024. ?reactive vs clonal lymphocytosis.  PLAN:  -discussed lab results from 09/08/2023 in detail with patient -CBC showed WBCs 9.6K, HGB 13.6, and PLT 288K -LDH WNL -TSH <0.010 uIU/mL - s/p Pituitary surgery. -free T4 1.05 ng/dL -hepatitis C and HIV testing negative -RPR Ser Ql negative -Angiotensin-Converting Enzyme 52 U/L -Sed rate 4 mm/hr - Flow cytometry showed no clonal B or T lymphocyte population. - Clinically the patient has no palpable lymphadenopathy or hepatosplenomegaly to suggest clinical concerns for lymphoproliferative disorder.  Also she is asymptomatic and her sed rate and LDH levels are within normal limits. - We discussed that her elevated lymphocytes could have been reactive from her viral respiratory infections but could also be a change in the distribution of her lymphocytes in the context of her panhypopituitarism. -Patient does not  have any other evidence of an autoimmune condition or sarcoidosis at this time. - No indication for additional invasive testing like bone marrow biopsy or whole-body imaging at this time. - Would recommend PCP monitor labs and if there is a concern for increasing lymphocytosis would be happy with seeing the patient back again. - Follow-up with PCP for age-appropriate cancer screening  FOLLOW-UP: RTC with PCP  The total time spent in the appointment was 15 minutes* .  All of the patient's questions were answered with apparent satisfaction. The patient knows to call the clinic with any problems, questions or concerns.   Emaline Saran MD MS AAHIVMS [facility] Hematology/Oncology Physician [facility]  .*  Total Encounter Time as defined by the Centers for Medicare and Medicaid Services includes, in addition to the face-to-face time of a patient visit (documented in the note above) non-face-to-face time: obtaining and reviewing outside history, ordering and reviewing medications, tests or procedures, care coordination (communications with other health care professionals or caregivers) and documentation in the medical record.    I,Mitra Faeizi,acting as a Neurosurgeon for Emaline Saran, MD.,have documented all relevant documentation on the behalf of Emaline Saran, MD,as directed by  Emaline Saran, MD while in the presence of Emaline Saran, MD.  .I have reviewed the above documentation for accuracy and completeness, and I agree with the above. .Nikia Mangino Kishore Rayann Jolley MD

## 2023-09-26 ENCOUNTER — Inpatient Hospital Stay (HOSPITAL_BASED_OUTPATIENT_CLINIC_OR_DEPARTMENT_OTHER): Admitting: Hematology

## 2023-09-26 DIAGNOSIS — D72820 Lymphocytosis (symptomatic): Secondary | ICD-10-CM

## 2023-09-26 DIAGNOSIS — E230 Hypopituitarism: Secondary | ICD-10-CM | POA: Diagnosis not present

## 2023-09-26 DIAGNOSIS — Z79890 Hormone replacement therapy (postmenopausal): Secondary | ICD-10-CM | POA: Diagnosis not present

## 2023-12-03 ENCOUNTER — Other Ambulatory Visit (HOSPITAL_COMMUNITY): Payer: Self-pay

## 2023-12-05 ENCOUNTER — Other Ambulatory Visit (HOSPITAL_COMMUNITY): Payer: Self-pay

## 2023-12-06 DIAGNOSIS — Z111 Encounter for screening for respiratory tuberculosis: Secondary | ICD-10-CM | POA: Diagnosis not present

## 2023-12-13 ENCOUNTER — Other Ambulatory Visit (HOSPITAL_COMMUNITY): Payer: Self-pay

## 2024-02-20 ENCOUNTER — Ambulatory Visit: Payer: Commercial Managed Care - PPO | Admitting: Internal Medicine

## 2024-02-20 NOTE — Progress Notes (Deleted)
 Patient ID: [name], female   DOB: 12/11/1980, 43 y.o.   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 She was previously seen by Dr Tommas. Last visit with me 1 year ago.  Interim history: She denies persistent fatigue, nausea, headaches, vision changes. After last visit, she requested a referral to fertility specialist - she saw Yalcinkaya.  She did not go ahead with follicular harvesting yet. She is currently investigated for lymphocytosis. Before last visit, she did not exercise as she was busy taking care of her father who has dementia.  She restarted exercise since then.  Reviewed history: Pt. has been dx with a nonsecreting pituitary adenoma in 04/2011 >> noticed a change in vision >> changed glasses >> vision still deteriorating >> VF abnormal >> MRI pituitary >> pituitary tumor >> 05/2011 TSR >> 09/2011 craniotomy by Dr Alix as there was still remnant pituitary tumor in the sella. 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 She had VF checked after the surgery >> normalized.   10/10/2015: Eye and visual field exam: - Vision 20/20 both eyes  - IOP 15 both eyes  - Visual fields: Complete resolution of bitemporal hemianopsia!  Pituitary MRI (01/26/2016): No change from the prior study. Postop pituitary resection. No residual pituitary tissue identified. Infundibulum not identified. No significant abnormality in the remainder of the brain.  04/2018: VF normal 04/2019: VF normal reportedly  Pituitary MRI (11/15/2019): No residual pituitary tissue identified or other suspicious masses  Pituitary MRI (03/03/2022): Unchanged empty sella without evidence of recurrent pituitary tumor.  Central hypothyroidism: On Levothyroxine  100 mcg daily: - in am - fasting - at least 30 min from b'fast - + calcium  >4h later - no iron - + multivitamins  at lunchtime - no PPIs - not on Biotin  Reviewed her thyroid  tests: Lab Results  Component Value Date   FREET4 1.05 09/08/2023   FREET4 1.02 02/18/2023   FREET4 0.91 02/12/2021   FREET4 0.94 09/29/2020   FREET4 0.84 02/14/2020   FREET4 0.84 02/06/2019   FREET4 0.79 02/06/2018   FREET4 1.01 02/04/2017   FREET4 0.89 04/08/2016   FREET4 0.69 02/06/2016  02/11/2022: Free T4 0.97 (0.61-1.12), free T3 3.44 (2.5-3.9) Per records from Dr Tommas: 12/23/2011: free t4: 1.08 Lab Results  Component Value Date   T3FREE 2.8 02/12/2021   T3FREE 3.7 09/29/2020   T3FREE 3.2 02/14/2020   T3FREE 3.6 02/06/2019   T3FREE 3.2 02/06/2018   T3FREE 4.2 02/04/2017   T3FREE 3.0 04/08/2016   T3FREE 4.0 02/06/2016   Central adrenal insufficiency On hydrocortisone  -10 mg in a.m. and 5 mg in p.m. -She did not have to use higher doses of cortisone or parenteral steroid solution since last visit.  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 Recent levels were normal: Lab Results  Component Value Date   PROLACTIN 21.8 02/06/2018   PROLACTIN 18.2 02/06/2016   PROLACTIN 24.9 04/18/2015   PROLACTIN 20.9 02/06/2015   PROLACTIN 0.7 01/22/2014   PROLACTIN 41.6 05/17/2011  04/11/2013: Prolactin 0.7 12/20/2012: Prolactin <0.1 12/23/2011: Prolactin 42.5  GH deficiency She is not on growth hormone 07/03/2013: IGF-I 34 (73-243) 06/21/2012: IGF-I 34 (73-243) 12/23/2011 IGF-I  19 (77-271)  She also has low BMD for age: 30/26/2021 Lumbar spine L1-L4 Femoral neck (FN)  Z-score -0.2 RFN: -1.8 LFN: -2.2  Change in BMD from previous DXA test (%)  +3.2%* -0.7%  (*) statistically significant   02/16/2017 Lumbar spine L1-L4 Femoral neck (FN)  Z-score -0.5 RFN: -1.9 LFN: -2.2   Change in BMD from previous DXA test (%) Down 0.9% Up 1.7%  (*) statistically significant    Lumbar spine (L1-L4) Z score Femoral neck (FN) Z score   02/18/2015 ([facility])  -0.3   RFN: -1.8 LFN: -2.3   06/21/2012  -0.7 RFN: -2.1 LFN: -2.0   She continues on calcium  carbonate + vitamin D  (600 mg +400 units) once a day + 1000 units vitamin D  daily.  Her vitamin D  levels have been normal: Lab Results  Component Value Date   VD25OH 52.20 02/18/2023   VD25OH 57.46 02/17/2022   VD25OH 36.21 02/12/2021   VD25OH 51.4 09/29/2020   VD25OH 19.9 (L) 02/14/2020   VD25OH 37.37 02/06/2019   VD25OH 53.53 02/06/2018   VD25OH 55.96 02/04/2017   VD25OH 59.36 02/06/2016   VD25OH 57.41 04/18/2015   She continues to exercise regularly. She had a stress fracture in the past but this healed she was able to restart exercise.  She was running between 15 to 25 mi a week, but decreased the amount of exercise afterwards due to being busy at work.  HbA1c is low in the prediabetic range: Lab Results  Component Value Date   HGBA1C 5.9 02/18/2023   HGBA1C 6.0 02/17/2022   HGBA1C 5.9 02/12/2021   HGBA1C 5.8 02/14/2020   HGBA1C 5.7 02/06/2019   HGBA1C 5.8 02/06/2018   HGBA1C 5.7 02/04/2017   HGBA1C 5.7 02/06/2016   HGBA1C 5.8 02/06/2015   At previous visits, she was drinking more regular sodas as she did not drink coffee or tea.  She reduced these afterwards and alternating regular with diet sodas >> no regular sodas now.  ROS: + see HPI  I reviewed pt's medications, allergies, PMH, social hx, family hx, and changes were documented in the history of present illness. Otherwise, unchanged from my initial visit note.  Past Medical History:  Diagnosis Date   Headache(784.0)    occasionally   Hypopituitarism after adenoma resection 01/21/2014   Pituitary tumor    Seasonal allergies    doesn't require meds   Vision changes    bilateral hemianopsia   Past Surgical History:  Procedure Laterality Date    BRAIN SURGERY     CRANIOTOMY  05/18/2011   Procedure: CRANIOTOMY HYPOPHYSECTOMY TRANSNASAL APPROACH;  Surgeon: Lamar LELON Peaches, MD;  Location: [facility];  Service: Neurosurgery;  Laterality: N/A;  Transphenoidal resection of pituitary tumor with Dr Mable Fat graft from abdomen   CRANIOTOMY  09/29/2011   Procedure: CRANIOTOMY TUMOR EXCISION;  Surgeon: Lamar LELON Peaches, MD;  Location: [facility];  Service: Neurosurgery;  Laterality: N/A;  Craniotomy for resection of pituitary tumor   NASAL SINUS SURGERY  05/18/2011   Procedure: ENDOSCOPIC SINUS SURGERY WITH STEALTH;  Surgeon: Alm LITTIE Mable, MD;  Location: [facility];  Service: ENT;  Laterality: N/A;  Opening for Transphenoidal for pituitary tumor   SALIVARY GLAND SURGERY  08/09/2007   History   Social History   Marital Status: Single    Spouse Name: N/A    Number of Children: 0   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     Cholecalciferol (VITAMIN D  PO) Take 1,000 Units by mouth.     drospirenone -ethinyl estradiol  (YAZ,GIANVI,LORYNA ) 3-0.02 MG tablet Take 1 tablet by mouth daily.     hydrocortisone  (CORTEF ) 5 MG tablet Take 2 tablets by mouth every morning and 1 tablet every morning. 300 tablet 3   hydrocortisone  sodium succinate (SOLU-CORTEF ) 100 MG injection Inject 100 mg as needed under skin 1 each 3   levothyroxine  (SYNTHROID ) 100 MCG tablet Take 1 tablet (100 mcg total) by mouth daily before breakfast. 90 tablet 3   Multiple Vitamin (MULTIVITAMIN) tablet Take 1 tablet by mouth daily.      promethazine -dextromethorphan (PROMETHAZINE -DM) 6.25-15 MG/5ML syrup Take 5 mls by mouth every 6 hours as needed for cough. (Patient not taking: Reported on 09/08/2023) 120 mL 0   Syringe/Needle, Disp, (SYRINGE 3CC/25GX1) 25G X 1 3 ML MISC Inject in the muscle as needed - use for solucortef 50 each prn   No current facility-administered medications on file prior to visit.    No Known Allergies   No family history on file.  PE: There were no vitals taken for this visit.  Wt Readings from Last 3 Encounters:  09/08/23 127 lb 4 oz (57.7 kg)  02/18/23 130 lb 3.2 oz (59.1 kg)  02/17/22 133 lb 3.2 oz (60.4 kg)   Constitutional: normal weight, in NAD Eyes:  EOMI, no exophthalmos ENT: no neck masses, no cervical lymphadenopathy Cardiovascular: RRR, No MRG Respiratory: CTA B Musculoskeletal: no deformities Skin:no rashes Neurological: no tremor with outstretched hands  ASSESSMENT: 1. Hypopituitarism - Visual field normalized: VF from 10/10/2015: Complete resolution of bitemporal hemianopsia!  2. Low bone mineral density for age  52. Prediabetes  PLAN:  1. Hypopituitarism -Reviewed the report of her MRI from 11/15/2019: No recurrences of her previous pituitary tumor a visual field was normal in 04/2019.  No further visual fields are needed for her. Latest pituitary MRI, which was checked 02/2022 (ordered by Dr. Leonel) showed empty sella, unchanged and decreased sinusitis. A. Central hypothyroidism - Latest free T4 was normal at last visit and again in 09/2023 - she continues on LT4 100 mcg daily - pt feels good on this dose. - we discussed about taking the thyroid  hormone every day, with water , >30 minutes before breakfast, separated by >4 hours from acid reflux medications, calcium , iron, multivitamins. Pt. is taking it correctly. - We will check her free T4 today  B. Central adrenal insufficiency - She takes hydrocortisone  10 mg in a.m. and 5 mg in p.m. - She has no significant  fatigue, nausea, abdominal pain, weight loss, headaches   - He did not have to apply sick day rules since last visit but she is aware about them and we reiterated them today: If you cannot keep anything down, including your hydrocortisone  medication, please go to the emergency room or your primary care physician office to get steroids injected in the muscle or vein. If you have a fever (more than 100 Fahrenheit), please double the dose of your hydrocortisone  for the duration of the fever. Do not run out  of your hydrocortisone  medication. - She has a med alert bracelet mentioning adrenal insufficiency  C. Hypogonadotropic hypogonadism - On OCPs - Saw Dr. Gorge - No immediate plans for pregnancy but she was contemplating follicular harvesting in the past  D. GH insufficiency - She has a low IGF-I -At previous visits, we did discuss about the indication for growth hormone treatment and possible benefits/side effects. She decided to forego starting this, especially since the cardiovascular benefits of replacement are not clearly defined and the fact that it is a daily injectable medicine.  E. No signs of DI.  - No increased thirst or urination   2. Low bone mineral density - Reviewed the latest bone density report from 08/2019: Her Z-score increased in spine and it was stable at the hips - Plan to repeat another bone density scan in 5 years from the previous, which would be next year - She had a low vitamin D  in 2021 (19.9) so I advised her to double up on the dose (she forgot to do so, but did start to take the vitamin D  consistently) >> vitamin D  level normalized - At last visit, she inquired about vitamin K2, and we discussed that she could definitely use this  - We will recheck her vitamin D  level today - She continues use calcium  daily - at last visit, she was not exercising, but she restarted  3.  Prediabetes - Latest HbA1c was only slightly elevated, but improved: Lab Results   Component Value Date   HGBA1C 5.9 02/18/2023  - Will recheck HbA1c today - She was less active before last visit but she restarted exercise  Needs refills of LT4.  Lela Fendt, MD PhD [facility] Endocrin[facility]

## 2024-02-28 ENCOUNTER — Ambulatory Visit: Admitting: Internal Medicine

## 2024-02-28 ENCOUNTER — Encounter: Payer: Self-pay | Admitting: Internal Medicine

## 2024-02-28 ENCOUNTER — Other Ambulatory Visit

## 2024-02-28 VITALS — BP 114/64 | HR 78 | Ht 63.0 in | Wt 128.4 lb

## 2024-02-28 DIAGNOSIS — E893 Postprocedural hypopituitarism: Secondary | ICD-10-CM

## 2024-02-28 DIAGNOSIS — R7303 Prediabetes: Secondary | ICD-10-CM | POA: Diagnosis not present

## 2024-02-28 DIAGNOSIS — E559 Vitamin D deficiency, unspecified: Secondary | ICD-10-CM | POA: Diagnosis not present

## 2024-02-28 DIAGNOSIS — E038 Other specified hypothyroidism: Secondary | ICD-10-CM | POA: Diagnosis not present

## 2024-02-28 DIAGNOSIS — M859 Disorder of bone density and structure, unspecified: Secondary | ICD-10-CM | POA: Diagnosis not present

## 2024-02-28 NOTE — Progress Notes (Signed)
 /Patient ID: Marisa Galloway, female   DOB: 1981/03/30, 43 y.o.   MRN: [id number] This note was precharted 02/20/2024.  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 She was previously seen by Dr Tommas. Last visit with me 1 year ago.  Interim history: She denies persistent fatigue, nausea, headaches, vision changes. She previously saw Yalcinkaya.  She did not go ahead with follicular harvesting yet.  Now taking foster parenting classes. She is currently investigated for lymphocytosis - likely 2/2 previous viral infections. Before last visit, she did not exercise as she was busy taking care of her father who has dementia.  She restarted exercise since then - but not consistently.  She is quite busy, working full-time, also teaching  and completing a PhD.   Reviewed history: Pt. has been dx with a nonsecreting pituitary adenoma in 04/2011 >> noticed a change in vision >> changed glasses >> vision still deteriorating >> VF abnormal >> MRI pituitary >> pituitary tumor >> 05/2011 TSR >> 09/2011 craniotomy by Dr Alix as there was still remnant pituitary tumor in the sella. 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 She had VF checked after the surgery >> normalized.   10/10/2015: Eye and visual field exam: - Vision 20/20 both eyes  - IOP 15 both eyes  - Visual fields: Complete resolution of bitemporal hemianopsia!  Pituitary MRI (01/26/2016): No change from the prior study. Postop pituitary resection. No residual pituitary tissue identified. Infundibulum not identified. No significant abnormality in the remainder of the brain.  04/2018: VF normal 04/2019: VF normal reportedly  Pituitary MRI (11/15/2019): No residual pituitary tissue identified or other suspicious masses  Pituitary MRI (03/03/2022): Unchanged empty sella without evidence of recurrent pituitary  tumor.  Central hypothyroidism: On Levothyroxine  100 mcg daily: - in am - fasting - at least 30 min from b'fast - + calcium  >4h later - no iron - + multivitamins at lunchtime - no PPIs - not on Biotin  Reviewed her thyroid  tests: Lab Results  Component Value Date   FREET4 1.05 09/08/2023   FREET4 1.02 02/18/2023   FREET4 0.91 02/12/2021   FREET4 0.94 09/29/2020   FREET4 0.84 02/14/2020   FREET4 0.84 02/06/2019   FREET4 0.79 02/06/2018   FREET4 1.01 02/04/2017   FREET4 0.89 04/08/2016   FREET4 0.69 02/06/2016  02/11/2022: Free T4 0.97 (0.61-1.12), free T3 3.44 (2.5-3.9) Per records from Dr Tommas: 12/23/2011: free t4: 1.08 Lab Results  Component Value Date   T3FREE 2.8 02/12/2021   T3FREE 3.7 09/29/2020   T3FREE 3.2 02/14/2020   T3FREE 3.6 02/06/2019   T3FREE 3.2 02/06/2018   T3FREE 4.2 02/04/2017   T3FREE 3.0 04/08/2016   T3FREE 4.0 02/06/2016   Central adrenal insufficiency On hydrocortisone  -10 mg in a.m. and 5 mg in p.m. -She had to use higher doses of hydrocortisone  (flu) but no parenteral steroid solution since last visit.  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 Recent levels were normal: Lab Results  Component Value Date   PROLACTIN 21.8 02/06/2018   PROLACTIN 18.2 02/06/2016   PROLACTIN 24.9 04/18/2015   PROLACTIN 20.9 02/06/2015   PROLACTIN 0.7 01/22/2014   PROLACTIN 41.6 05/17/2011  04/11/2013: Prolactin  0.7 12/20/2012: Prolactin <0.1 12/23/2011: Prolactin 42.5  GH deficiency She is not on growth hormone 07/03/2013: IGF-I 34 (73-243) 06/21/2012: IGF-I 34 (73-243) 12/23/2011 IGF-I 19 (77-271)  She also has low BMD for age: 28/26/2021 Lumbar spine L1-L4 Femoral neck (FN)  Z-score -0.2 RFN: -1.8 LFN: -2.2   Change in BMD from previous DXA test (%)  +3.2%* -0.7%  (*) statistically significant   02/16/2017 Lumbar spine L1-L4 Femoral neck (FN)  Z-score -0.5 RFN: -1.9 LFN: -2.2  Change in BMD from previous DXA test (%) Down 0.9% Up 1.7%  (*) statistically significant    Lumbar spine (L1-L4) Z score Femoral neck (FN) Z score   02/18/2015 ([facility])  -0.3   RFN: -1.8 LFN: -2.3   06/21/2012  -0.7 RFN: -2.1 LFN: -2.0   She continues on calcium  carbonate + vitamin D  (600 mg +400 units) once a day + 1000 units vitamin D  daily.  Her vitamin D  levels have been normal: Lab Results  Component Value Date   VD25OH 52.20 02/18/2023   VD25OH 57.46 02/17/2022   VD25OH 36.21 02/12/2021   VD25OH 51.4 09/29/2020   VD25OH 19.9 (L) 02/14/2020   VD25OH 37.37 02/06/2019   VD25OH 53.53 02/06/2018   VD25OH 55.96 02/04/2017   VD25OH 59.36 02/06/2016   VD25OH 57.41 04/18/2015   She had a stress fracture in the past but this healed she was able to restart exercise.   HbA1c is low in the prediabetic range: Lab Results  Component Value Date   HGBA1C 5.9 02/18/2023   HGBA1C 6.0 02/17/2022   HGBA1C 5.9 02/12/2021   HGBA1C 5.8 02/14/2020   HGBA1C 5.7 02/06/2019   HGBA1C 5.8 02/06/2018   HGBA1C 5.7 02/04/2017   HGBA1C 5.7 02/06/2016   HGBA1C 5.8 02/06/2015   At previous visits, she was drinking more regular sodas as she did not drink coffee or tea.  She reduced these afterwards and alternating regular with diet sodas >> no regular sodas now.  ROS: + see HPI  I reviewed pt's medications, allergies, PMH, social hx, family hx, and changes were documented in the history of present illness. Otherwise, unchanged from my initial visit note.  Past Medical History:  Diagnosis Date   Headache(784.0)    occasionally   Hypopituitarism after adenoma resection 01/21/2014   Pituitary tumor    Seasonal allergies    doesn't require meds   Vision changes    bilateral hemianopsia   Past Surgical History:   Procedure Laterality Date   BRAIN SURGERY     CRANIOTOMY  05/18/2011   Procedure: CRANIOTOMY HYPOPHYSECTOMY TRANSNASAL APPROACH;  Surgeon: Lamar LELON Peaches, MD;  Location: [facility];  Service: Neurosurgery;  Laterality: N/A;  Transphenoidal resection of pituitary tumor with Dr Mable Fat graft from abdomen   CRANIOTOMY  09/29/2011   Procedure: CRANIOTOMY TUMOR EXCISION;  Surgeon: Lamar LELON Peaches, MD;  Location: [facility];  Service: Neurosurgery;  Laterality: N/A;  Craniotomy for resection of pituitary tumor   NASAL SINUS SURGERY  05/18/2011   Procedure: ENDOSCOPIC SINUS SURGERY WITH STEALTH;  Surgeon: Alm LITTIE Mable, MD;  Location: [facility];  Service: ENT;  Laterality: N/A;  Opening for Transphenoidal for pituitary tumor   SALIVARY GLAND SURGERY  08/09/2007   History   Social History   Marital Status: Single    Spouse Name: N/A    Number of Children: 0   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     Cholecalciferol (VITAMIN D  PO) Take 1,000 Units by mouth.     drospirenone -ethinyl estradiol  (YAZ,GIANVI,LORYNA ) 3-0.02 MG tablet Take 1 tablet by mouth daily.     hydrocortisone  (CORTEF ) 5 MG tablet Take 2 tablets by mouth every morning and 1 tablet every morning. 300 tablet 3   hydrocortisone  sodium succinate  (SOLU-CORTEF ) 100 MG injection Inject 100 mg as needed under skin 1 each 3   levothyroxine  (SYNTHROID ) 100 MCG tablet Take 1 tablet (100 mcg total) by mouth daily before breakfast. 90 tablet 3   Multiple Vitamin (MULTIVITAMIN) tablet Take 1 tablet by mouth daily.      promethazine -dextromethorphan (PROMETHAZINE -DM) 6.25-15 MG/5ML syrup Take 5 mls by mouth every 6 hours as needed for cough. (Patient not taking: Reported on 09/08/2023) 120 mL 0   Syringe/Needle, Disp, (SYRINGE 3CC/25GX1) 25G X 1 3 ML MISC Inject in the muscle as needed - use for solucortef 50 each prn   No current facility-administered medications on file prior to visit.    No Known Allergies   No family history on file.  PE: BP 114/64   Pulse 78   Ht 5' 3 (1.6 m)   Wt 128 lb 6.4 oz (58.2 kg)   SpO2 99%   BMI 22.75 kg/m   Wt Readings from Last 3 Encounters:  02/28/24 128 lb 6.4 oz (58.2 kg)  09/08/23 127 lb 4 oz (57.7 kg)  02/18/23 130 lb 3.2 oz (59.1 kg)   Constitutional: normal weight, in NAD Eyes:  EOMI, no exophthalmos ENT: no neck masses, no cervical lymphadenopathy Cardiovascular: RRR, No MRG Respiratory: CTA B Musculoskeletal: no deformities Skin:no rashes Neurological: no tremor with outstretched hands  ASSESSMENT: 1. Hypopituitarism - Visual field normalized: VF from 10/10/2015: Complete resolution of bitemporal hemianopsia!  2. Low bone mineral density for age  56. Prediabetes  PLAN:  1. Hypopituitarism -Reviewed the report of her MRI from 11/15/2019: No recurrences of her previous pituitary tumor a visual field was normal in 04/2019.  No further visual fields are needed for her. Latest pituitary MRI, which was checked 02/2022 (ordered by Dr. Leonel) showed empty sella, unchanged and decreased sinusitis. She is getting VFs checked annually now. A. Central hypothyroidism - Latest free T4 was at goal in 09/2023 - she continues on LT4 100 mcg daily - pt feels good on this dose. - we discussed about taking the thyroid  hormone every day, with water , >30 minutes before breakfast, separated by >4 hours from acid reflux medications, calcium , iron, multivitamins. Pt. is taking it correctly. - will check her free T4 today  B. Central adrenal insufficiency -  She takes hydrocortisone  10 mg in a.m. and 5 mg in p.m. - She has no significant fatigue, nausea, abdominal pain, weight loss, headaches   - She is aware about sick day rules -she did have to double up on the dose of steroid when she had flu last winter: If you cannot keep anything down, including your hydrocortisone  medication, please go to the emergency room or your primary care physician office to get steroids injected in the muscle or vein. If you have a  fever (more than 100 Fahrenheit), please double the dose of your hydrocortisone  for the duration of the fever. Do not run out of your hydrocortisone  medication. - She has a med alert bracelet mentioning adrenal insufficiency  C. Hypogonadotropic hypogonadism - On OCPs - Saw Dr. Gorge - No immediate plans for pregnancy but she was contemplating follicular harvesting in the past  D. GH insufficiency - She has a low IGF-I -At previous visits, we did discuss about the indication for growth hormone treatment and possible benefits/side effects. She decided to forego starting this, especially since the cardiovascular benefits of replacement are not clearly defined and the fact that it is a daily injectable medicine.  E. No signs of DI.  - No increased thirst or urination   2. Low bone mineral density - Reviewed the latest bone density report from 08/2019: her Z-score increased in spine and it was stable at the hips  - Plan to repeat another bone density scan in 5 years from the previous, which would be next year - She had a low vitamin D  in 2021 (19.9) so I advised her to double up on the dose (she forgot to do so, but did start to take the vitamin D  consistently) >> vitamin D  level normalized - At last visit, she inquired about using vitamin K2 we discussed that she could definitely start this - We will recheck her vitamin D  level today - Continues calcium  daily - She restarted exercise since last visit but not consistently.  She is very  busy at work.  3.  Prediabetes - Latest HbA1c was slightly elevated, but improved: Lab Results  Component Value Date   HGBA1C 5.9 02/18/2023  - Will recheck her HbA1c today - She is not exercising consistently and we discussed that this would greatly help with her HbA1c levels.  Orders Placed This Encounter  Procedures   T4, free   Hemoglobin A1c   VITAMIN D  25 Hydroxy (Vit-D Deficiency, Fractures)   Needs refills of LT4, HC, HC sol.   Lela Fendt, MD PhD [facility] End[facility]

## 2024-02-28 NOTE — Patient Instructions (Signed)
Please stop at the lab.  Please continue levothyroxine 100 mcg daily.  Take the thyroid hormone every day, with water, at least 30 minutes before breakfast, separated by at least 4 hours from: - acid reflux medications - calcium - iron - multivitamins  Please continue hydrocortisone 10 mg in a.m. and 5 mg in p.m. (no later than 6 PM).  - You absolutely need to take this medication every day and not skip doses. - Please double the dose if you have a fever, for the duration of the fever. - If you cannot take anything by mouth (vomiting) or you have severe diarrhea so that you eliminate the hydrocortisone pills in your stool, please make sure that you get the hydrocortisone in the vein instead - go to the nearest emergency department/urgent care or you may go to your PCPs office   Please return in 1 year.

## 2024-02-29 ENCOUNTER — Ambulatory Visit: Payer: Self-pay | Admitting: Internal Medicine

## 2024-02-29 ENCOUNTER — Other Ambulatory Visit (HOSPITAL_COMMUNITY): Payer: Self-pay

## 2024-02-29 LAB — HEMOGLOBIN A1C
Hgb A1c MFr Bld: 5.5 % (ref <5.7)
Mean Plasma Glucose: 111 mg/dL
eAG (mmol/L): 6.2 mmol/L

## 2024-02-29 LAB — T4, FREE: Free T4: 1.3 ng/dL (ref 0.8–1.8)

## 2024-02-29 LAB — VITAMIN D 25 HYDROXY (VIT D DEFICIENCY, FRACTURES): Vit D, 25-Hydroxy: 21 ng/mL — ABNORMAL LOW (ref 30–100)

## 2024-02-29 MED ORDER — HYDROCORTISONE SOD SUC (PF) 100 MG IJ SOLR
INTRAMUSCULAR | 99 refills | Status: AC
  Filled 2024-02-29: qty 1, 1d supply, fill #0

## 2024-02-29 MED ORDER — HYDROCORTISONE 5 MG PO TABS
ORAL_TABLET | ORAL | 3 refills | Status: AC
  Filled 2024-02-29 – 2024-03-12 (×2): qty 300, 100d supply, fill #0

## 2024-02-29 MED ORDER — LEVOTHYROXINE SODIUM 100 MCG PO TABS
100.0000 ug | ORAL_TABLET | Freq: Every day | ORAL | 3 refills | Status: AC
  Filled 2024-02-29 – 2024-03-12 (×2): qty 90, 90d supply, fill #0

## 2024-02-29 NOTE — Addendum Note (Signed)
 Addended by: TRIXIE FILE on: 02/29/2024 12:52 PM   Modules accepted: Orders

## 2024-03-13 ENCOUNTER — Other Ambulatory Visit (HOSPITAL_COMMUNITY): Payer: Self-pay

## 2024-03-19 ENCOUNTER — Other Ambulatory Visit: Payer: Self-pay | Admitting: Family Medicine

## 2024-03-19 DIAGNOSIS — Z1231 Encounter for screening mammogram for malignant neoplasm of breast: Secondary | ICD-10-CM

## 2024-04-13 ENCOUNTER — Ambulatory Visit

## 2024-05-01 ENCOUNTER — Encounter: Payer: Self-pay | Admitting: Internal Medicine

## 2024-05-08 ENCOUNTER — Ambulatory Visit

## 2024-05-10 ENCOUNTER — Other Ambulatory Visit (HOSPITAL_COMMUNITY): Payer: Self-pay

## 2024-05-11 ENCOUNTER — Other Ambulatory Visit (HOSPITAL_COMMUNITY): Payer: Self-pay

## 2024-05-11 ENCOUNTER — Encounter: Payer: Self-pay | Admitting: *Deleted

## 2024-05-11 NOTE — Progress Notes (Addendum)
 Pt attended screening event on 05/11/24 where her blood pressure was 96/71, pt did not note any SDOH needs at the time of the event. Pt documented that she does have insurance and a PCP. Pt also documented that she is not an active smoker.

## 2024-05-15 ENCOUNTER — Ambulatory Visit

## 2025-02-27 ENCOUNTER — Ambulatory Visit: Admitting: Internal Medicine
